# Patient Record
Sex: Male | Born: 1985 | Race: White | Hispanic: No | Marital: Married | Smoking: Former smoker
Health system: Southern US, Community
[De-identification: ages and names within clinical notes are randomized; demographics above are authoritative.]

## PROBLEM LIST (undated history)

## (undated) DIAGNOSIS — M549 Dorsalgia, unspecified: Secondary | ICD-10-CM

## (undated) DIAGNOSIS — Z789 Other specified health status: Secondary | ICD-10-CM

## (undated) DIAGNOSIS — G8929 Other chronic pain: Secondary | ICD-10-CM

## (undated) HISTORY — PX: NOSE SURGERY: SHX723

## (undated) HISTORY — PX: BACK SURGERY: SHX140

---
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
## 2014-07-01 HISTORY — PX: CERVICAL SPINE SURGERY: SHX589

## 2014-07-01 HISTORY — PX: TIBIA FRACTURE SURGERY: SHX806

## 2018-06-10 ENCOUNTER — Other Ambulatory Visit: Payer: Self-pay | Admitting: Orthopaedic Surgery

## 2018-07-02 NOTE — Pre-Procedure Instructions (Signed)
Trevor Clarke  07/02/2018      [id number] - [place], [place] - [name] [place] [address] Phone: [phone] Fax: [phone]    Your procedure is scheduled on January 8th.  Report to [facility] Admitting at 1045 A.M.  Call this number if you have problems the morning of surgery:  [phone]   Remember:  Do not eat or drink after midnight.    Take these medicines the morning of surgery with A SIP OF WATER  gabapentin (NEURONTIN)  7 days prior to surgery STOP taking any Aspirin (unless otherwise instructed by your surgeon), Aleve, Naproxen, Ibuprofen, Motrin, Advil, Goody's, BC's, all herbal medications, fish oil, and all vitamins.    Do not wear jewelry.  Do not wear lotions, powders, or colognes, or deodorant.  Men may shave face and neck.  Do not bring valuables to the hospital.  [facility] is not responsible for any belongings or valuables.  Contacts, dentures or bridgework may not be worn into surgery.  Leave your suitcase in the car.  After surgery it may be brought to your room.  For patients admitted to the hospital, discharge time will be determined by your treatment team.  Patients discharged the day of surgery will not be allowed to drive home.    [place]- Preparing For Surgery  Before surgery, you can play an important role. Because skin is not sterile, your skin needs to be as free of germs as possible. You can reduce the number of germs on your skin by washing with CHG (chlorahexidine gluconate) Soap before surgery.  CHG is an antiseptic cleaner which kills germs and bonds with the skin to continue killing germs even after washing.    Oral Hygiene is also important to reduce your risk of infection.  Remember - BRUSH YOUR TEETH THE MORNING OF SURGERY WITH YOUR REGULAR TOOTHPASTE  Please do not use if you have an allergy to CHG or antibacterial soaps. If your skin becomes reddened/irritated stop  using the CHG.  Do not shave (including legs and underarms) for at least 48 hours prior to first CHG shower. It is OK to shave your face.  Please follow these instructions carefully.   1. Shower the NIGHT BEFORE SURGERY and the MORNING OF SURGERY with CHG.   2. If you chose to wash your hair, wash your hair first as usual with your normal shampoo.  3. After you shampoo, rinse your hair and body thoroughly to remove the shampoo.  4. Use CHG as you would any other liquid soap. You can apply CHG directly to the skin and wash gently with a scrungie or a clean washcloth.   5. Apply the CHG Soap to your body ONLY FROM THE NECK DOWN.  Do not use on open wounds or open sores. Avoid contact with your eyes, ears, mouth and genitals (private parts). Wash Face and genitals (private parts)  with your normal soap.  6. Wash thoroughly, paying special attention to the area where your surgery will be performed.  7. Thoroughly rinse your body with warm water from the neck down.  8. DO NOT shower/wash with your normal soap after using and rinsing off the CHG Soap.  9. Pat yourself dry with a CLEAN TOWEL.  10. Wear CLEAN PAJAMAS to bed the night before surgery, wear comfortable clothes the morning of surgery  11. Place CLEAN SHEETS on your bed the night of your first shower and DO NOT SLEEP  WITH PETS.    Day of Surgery:  Do not apply any deodorants/lotions.  Please wear clean clothes to the hospital/surgery center.   Remember to brush your teeth WITH YOUR REGULAR TOOTHPASTE.    Please read over the following fact sheets that you were given.

## 2018-07-03 ENCOUNTER — Encounter (HOSPITAL_COMMUNITY): Payer: Self-pay

## 2018-07-03 ENCOUNTER — Encounter (HOSPITAL_COMMUNITY)
Admission: RE | Admit: 2018-07-03 | Discharge: 2018-07-03 | Disposition: A | Discharge status: Home / Self Care | Payer: Worker's Compensation | Source: Ambulatory Visit | Attending: Orthopaedic Surgery | Admitting: Orthopaedic Surgery

## 2018-07-03 ENCOUNTER — Other Ambulatory Visit: Payer: Self-pay

## 2018-07-03 DIAGNOSIS — Z01812 Encounter for preprocedural laboratory examination: Secondary | ICD-10-CM | POA: Insufficient documentation

## 2018-07-03 HISTORY — DX: Other specified health status: Z78.9

## 2018-07-03 LAB — CBC
HEMATOCRIT: 47.5 % (ref 39.0–52.0)
Hemoglobin: 15 g/dL (ref 13.0–17.0)
MCH: 27.6 pg (ref 26.0–34.0)
MCHC: 31.6 g/dL (ref 30.0–36.0)
MCV: 87.5 fL (ref 80.0–100.0)
Platelets: 237 10*3/uL (ref 150–400)
RBC: 5.43 MIL/uL (ref 4.22–5.81)
RDW: 12.5 % (ref 11.5–15.5)
WBC: 10.3 10*3/uL (ref 4.0–10.5)
nRBC: 0 % (ref 0.0–0.2)

## 2018-07-03 LAB — SURGICAL PCR SCREEN
MRSA, PCR: NEGATIVE
Staphylococcus aureus: NEGATIVE

## 2018-07-03 NOTE — Progress Notes (Signed)
PCP - patient denies Cardiologist - patient denies  Chest x-ray - n/a EKG - n/a Stress Test - patient denies ECHO - patient denies Cardiac Cath - patient denies  Sleep Study - patient denies CPAP -   Fasting Blood Sugar -  Checks Blood Sugar _____ times a day  Blood Thinner Instructions: Aspirin Instructions:  Anesthesia review: n/a  Patient denies shortness of breath, fever, cough and chest pain at PAT appointment   Patient verbalized understanding of instructions that were given to them at the PAT appointment. Patient was also instructed that they will need to review over the PAT instructions again at home before surgery.

## 2018-07-08 ENCOUNTER — Other Ambulatory Visit: Payer: Self-pay

## 2018-07-08 ENCOUNTER — Encounter (HOSPITAL_COMMUNITY): Payer: Self-pay

## 2018-07-08 ENCOUNTER — Ambulatory Visit (HOSPITAL_COMMUNITY): Payer: Worker's Compensation | Admitting: Certified Registered Nurse Anesthetist

## 2018-07-08 ENCOUNTER — Ambulatory Visit (HOSPITAL_COMMUNITY)
Admission: RE | Admit: 2018-07-08 | Discharge: 2018-07-09 | Disposition: A | Discharge status: Home / Self Care | Payer: Worker's Compensation | Attending: Orthopaedic Surgery | Admitting: Orthopaedic Surgery

## 2018-07-08 ENCOUNTER — Ambulatory Visit (HOSPITAL_COMMUNITY): Payer: Worker's Compensation

## 2018-07-08 ENCOUNTER — Encounter (HOSPITAL_COMMUNITY)
Admission: RE | Disposition: A | Discharge status: Home / Self Care | Payer: Self-pay | Source: Home / Self Care | Attending: Orthopaedic Surgery

## 2018-07-08 DIAGNOSIS — R200 Anesthesia of skin: Secondary | ICD-10-CM | POA: Diagnosis not present

## 2018-07-08 DIAGNOSIS — Z79899 Other long term (current) drug therapy: Secondary | ICD-10-CM | POA: Insufficient documentation

## 2018-07-08 DIAGNOSIS — R531 Weakness: Secondary | ICD-10-CM | POA: Diagnosis not present

## 2018-07-08 DIAGNOSIS — M19171 Post-traumatic osteoarthritis, right ankle and foot: Secondary | ICD-10-CM | POA: Insufficient documentation

## 2018-07-08 DIAGNOSIS — Z791 Long term (current) use of non-steroidal anti-inflammatories (NSAID): Secondary | ICD-10-CM | POA: Insufficient documentation

## 2018-07-08 DIAGNOSIS — Z981 Arthrodesis status: Secondary | ICD-10-CM

## 2018-07-08 DIAGNOSIS — T8484XA Pain due to internal orthopedic prosthetic devices, implants and grafts, initial encounter: Secondary | ICD-10-CM | POA: Diagnosis not present

## 2018-07-08 DIAGNOSIS — Z6836 Body mass index (BMI) 36.0-36.9, adult: Secondary | ICD-10-CM | POA: Diagnosis not present

## 2018-07-08 DIAGNOSIS — Z880 Allergy status to penicillin: Secondary | ICD-10-CM | POA: Diagnosis not present

## 2018-07-08 DIAGNOSIS — Z87891 Personal history of nicotine dependence: Secondary | ICD-10-CM | POA: Insufficient documentation

## 2018-07-08 DIAGNOSIS — Y793 Surgical instruments, materials and orthopedic devices (including sutures) associated with adverse incidents: Secondary | ICD-10-CM | POA: Insufficient documentation

## 2018-07-08 DIAGNOSIS — Z419 Encounter for procedure for purposes other than remedying health state, unspecified: Secondary | ICD-10-CM

## 2018-07-08 HISTORY — DX: Dorsalgia, unspecified: M54.9

## 2018-07-08 HISTORY — PX: ANKLE ARTHRODESIS: SUR49

## 2018-07-08 HISTORY — PX: HARDWARE REMOVAL: SHX979

## 2018-07-08 HISTORY — PX: ANKLE FUSION: SHX5718

## 2018-07-08 HISTORY — DX: Other chronic pain: G89.29

## 2018-07-08 SURGERY — ANKLE FUSION
Anesthesia: Choice | Site: Ankle | Laterality: Right

## 2018-07-08 MED ORDER — METHOCARBAMOL 500 MG PO TABS
ORAL_TABLET | ORAL | Status: AC
  Filled 2018-07-08: qty 1

## 2018-07-08 MED ORDER — 0.9 % SODIUM CHLORIDE (POUR BTL) OPTIME
TOPICAL | Status: DC | PRN
  Administered 2018-07-08: 1000 mL

## 2018-07-08 MED ORDER — METOCLOPRAMIDE HCL 5 MG/ML IJ SOLN: 5.0000 mg | Freq: Three times a day (TID) | INTRAMUSCULAR | Status: DC | PRN

## 2018-07-08 MED ORDER — FENTANYL CITRATE (PF) 100 MCG/2ML IJ SOLN
INTRAMUSCULAR | Status: AC
  Filled 2018-07-08: qty 2

## 2018-07-08 MED ORDER — ESMOLOL HCL 100 MG/10ML IV SOLN
INTRAVENOUS | Status: DC | PRN
  Administered 2018-07-08: 30 mg via INTRAVENOUS
  Administered 2018-07-08: 20 mg via INTRAVENOUS

## 2018-07-08 MED ORDER — DEXAMETHASONE SODIUM PHOSPHATE 10 MG/ML IJ SOLN
INTRAMUSCULAR | Status: DC | PRN
  Administered 2018-07-08: 10 mg via INTRAVENOUS

## 2018-07-08 MED ORDER — DEXAMETHASONE SODIUM PHOSPHATE 10 MG/ML IJ SOLN
INTRAMUSCULAR | Status: AC
  Filled 2018-07-08: qty 1

## 2018-07-08 MED ORDER — METHOCARBAMOL 500 MG PO TABS
500.0000 mg | ORAL_TABLET | Freq: Four times a day (QID) | ORAL | Status: DC | PRN
  Administered 2018-07-08 – 2018-07-09 (×2): 500 mg via ORAL
  Filled 2018-07-08: qty 1

## 2018-07-08 MED ORDER — CLINDAMYCIN PHOSPHATE 600 MG/50ML IV SOLN
600.0000 mg | Freq: Four times a day (QID) | INTRAVENOUS | Status: AC
  Administered 2018-07-08 – 2018-07-09 (×3): 600 mg via INTRAVENOUS
  Filled 2018-07-08 (×3): qty 50

## 2018-07-08 MED ORDER — ONDANSETRON HCL 4 MG PO TABS: 4.0000 mg | ORAL_TABLET | Freq: Four times a day (QID) | ORAL | Status: DC | PRN

## 2018-07-08 MED ORDER — ONDANSETRON HCL 4 MG/2ML IJ SOLN
INTRAMUSCULAR | Status: AC
  Filled 2018-07-08: qty 2

## 2018-07-08 MED ORDER — MIDAZOLAM HCL 2 MG/2ML IJ SOLN
INTRAMUSCULAR | Status: AC
  Administered 2018-07-08: 2 mg via INTRAVENOUS
  Filled 2018-07-08: qty 2

## 2018-07-08 MED ORDER — GABAPENTIN 300 MG PO CAPS
300.0000 mg | ORAL_CAPSULE | Freq: Three times a day (TID) | ORAL | Status: DC
  Administered 2018-07-08 – 2018-07-09 (×2): 300 mg via ORAL
  Filled 2018-07-08 (×2): qty 1

## 2018-07-08 MED ORDER — METHOCARBAMOL 1000 MG/10ML IJ SOLN
500.0000 mg | Freq: Four times a day (QID) | INTRAVENOUS | Status: DC | PRN
  Filled 2018-07-08: qty 5

## 2018-07-08 MED ORDER — MIDAZOLAM HCL 2 MG/2ML IJ SOLN
2.0000 mg | Freq: Once | INTRAMUSCULAR | Status: AC
  Administered 2018-07-08: 2 mg via INTRAVENOUS

## 2018-07-08 MED ORDER — DEXMEDETOMIDINE HCL IN NACL 200 MCG/50ML IV SOLN
INTRAVENOUS | Status: DC | PRN
  Administered 2018-07-08 (×5): 8 ug via INTRAVENOUS

## 2018-07-08 MED ORDER — ONDANSETRON HCL 4 MG/2ML IJ SOLN: 4.0000 mg | Freq: Four times a day (QID) | INTRAMUSCULAR | Status: DC | PRN

## 2018-07-08 MED ORDER — CHLORHEXIDINE GLUCONATE 4 % EX LIQD: 60.0000 mL | Freq: Once | CUTANEOUS | Status: DC

## 2018-07-08 MED ORDER — CLINDAMYCIN PHOSPHATE 900 MG/50ML IV SOLN
900.0000 mg | Freq: Once | INTRAVENOUS | Status: AC
  Administered 2018-07-08: 900 mg via INTRAVENOUS

## 2018-07-08 MED ORDER — FENTANYL CITRATE (PF) 100 MCG/2ML IJ SOLN
INTRAMUSCULAR | Status: DC | PRN
  Administered 2018-07-08: 25 ug via INTRAVENOUS
  Administered 2018-07-08 (×2): 50 ug via INTRAVENOUS
  Administered 2018-07-08: 25 ug via INTRAVENOUS
  Administered 2018-07-08: 50 ug via INTRAVENOUS
  Administered 2018-07-08: 25 ug via INTRAVENOUS
  Administered 2018-07-08: 50 ug via INTRAVENOUS
  Administered 2018-07-08 (×7): 25 ug via INTRAVENOUS
  Administered 2018-07-08: 50 ug via INTRAVENOUS

## 2018-07-08 MED ORDER — ASPIRIN 325 MG PO TABS: 325.0000 mg | ORAL_TABLET | Freq: Every day | ORAL | 11 refills | Status: DC

## 2018-07-08 MED ORDER — OXYCODONE HCL 5 MG PO TABS
5.0000 mg | ORAL_TABLET | ORAL | Status: DC | PRN
  Administered 2018-07-08 – 2018-07-09 (×3): 10 mg via ORAL
  Filled 2018-07-08 (×3): qty 2

## 2018-07-08 MED ORDER — OXYCODONE HCL 5 MG PO TABS: 5.0000 mg | ORAL_TABLET | ORAL | 0 refills | Status: AC | PRN

## 2018-07-08 MED ORDER — FENTANYL CITRATE (PF) 250 MCG/5ML IJ SOLN
INTRAMUSCULAR | Status: AC
  Filled 2018-07-08: qty 5

## 2018-07-08 MED ORDER — KETOROLAC TROMETHAMINE 30 MG/ML IJ SOLN
INTRAMUSCULAR | Status: AC
  Filled 2018-07-08: qty 1

## 2018-07-08 MED ORDER — LIDOCAINE 2% (20 MG/ML) 5 ML SYRINGE
INTRAMUSCULAR | Status: AC
  Filled 2018-07-08: qty 5

## 2018-07-08 MED ORDER — PROPOFOL 10 MG/ML IV BOLUS
INTRAVENOUS | Status: DC | PRN
  Administered 2018-07-08: 250 mg via INTRAVENOUS

## 2018-07-08 MED ORDER — BUPIVACAINE-EPINEPHRINE (PF) 0.5% -1:200000 IJ SOLN
INTRAMUSCULAR | Status: DC | PRN
  Administered 2018-07-08: 15 mL via PERINEURAL
  Administered 2018-07-08: 25 mL via PERINEURAL

## 2018-07-08 MED ORDER — FENTANYL CITRATE (PF) 100 MCG/2ML IJ SOLN
100.0000 ug | Freq: Once | INTRAMUSCULAR | Status: AC
  Administered 2018-07-08: 100 ug via INTRAVENOUS

## 2018-07-08 MED ORDER — HYDROMORPHONE HCL 1 MG/ML IJ SOLN: 0.5000 mg | INTRAMUSCULAR | Status: DC | PRN

## 2018-07-08 MED ORDER — NAPROXEN 250 MG PO TABS
500.0000 mg | ORAL_TABLET | Freq: Two times a day (BID) | ORAL | Status: DC
  Administered 2018-07-09: 500 mg via ORAL
  Filled 2018-07-08 (×3): qty 2

## 2018-07-08 MED ORDER — LIDOCAINE 2% (20 MG/ML) 5 ML SYRINGE
INTRAMUSCULAR | Status: DC | PRN
  Administered 2018-07-08: 60 mg via INTRAVENOUS

## 2018-07-08 MED ORDER — LACTATED RINGERS IV SOLN
INTRAVENOUS | Status: DC
  Administered 2018-07-08 (×2): via INTRAVENOUS

## 2018-07-08 MED ORDER — METOCLOPRAMIDE HCL 5 MG PO TABS: 5.0000 mg | ORAL_TABLET | Freq: Three times a day (TID) | ORAL | Status: DC | PRN

## 2018-07-08 MED ORDER — HYDROCODONE-ACETAMINOPHEN 5-325 MG PO TABS: 1.0000 | ORAL_TABLET | ORAL | Status: DC | PRN

## 2018-07-08 MED ORDER — DEXMEDETOMIDINE HCL IN NACL 200 MCG/50ML IV SOLN
INTRAVENOUS | Status: AC
  Filled 2018-07-08: qty 50

## 2018-07-08 MED ORDER — KETOROLAC TROMETHAMINE 30 MG/ML IJ SOLN
INTRAMUSCULAR | Status: DC | PRN
  Administered 2018-07-08: 30 mg via INTRAVENOUS

## 2018-07-08 MED ORDER — DOCUSATE SODIUM 100 MG PO CAPS
100.0000 mg | ORAL_CAPSULE | Freq: Two times a day (BID) | ORAL | Status: DC
  Filled 2018-07-08: qty 1

## 2018-07-08 MED ORDER — CLINDAMYCIN PHOSPHATE 900 MG/50ML IV SOLN
INTRAVENOUS | Status: AC
  Filled 2018-07-08: qty 50

## 2018-07-08 MED ORDER — ONDANSETRON HCL 4 MG/2ML IJ SOLN
INTRAMUSCULAR | Status: DC | PRN
  Administered 2018-07-08 (×2): 4 mg via INTRAVENOUS

## 2018-07-08 SURGICAL SUPPLY — 82 items
BANDAGE ACE 4X5 VEL STRL LF (GAUZE/BANDAGES/DRESSINGS) ×4 IMPLANT
BANDAGE ACE 6X5 VEL STRL LF (GAUZE/BANDAGES/DRESSINGS) ×4 IMPLANT
BANDAGE ESMARK 6X9 LF (GAUZE/BANDAGES/DRESSINGS) ×2 IMPLANT
BENZOIN TINCTURE PRP APPL 2/3 (GAUZE/BANDAGES/DRESSINGS) IMPLANT
BIT DRILL CANN LNG FLUTE 3.0 (BIT) IMPLANT
BIT DRILL CANNULATED 3.0 (BIT) ×4
BLADE CUDA GRT WHITE 3.5 (BLADE) IMPLANT
BLADE SURG 15 STRL LF DISP TIS (BLADE) ×2 IMPLANT
BLADE SURG 15 STRL SS (BLADE) ×2
BNDG COHESIVE 4X5 TAN STRL (GAUZE/BANDAGES/DRESSINGS) IMPLANT
BNDG ESMARK 4X9 LF (GAUZE/BANDAGES/DRESSINGS) IMPLANT
BNDG ESMARK 6X9 LF (GAUZE/BANDAGES/DRESSINGS) ×4
BUR CUDA 2.9 (BURR) IMPLANT
BUR CUDA 2.9MM (BURR)
BUR GATOR 2.9 (BURR) IMPLANT
BUR GATOR 2.9MM (BURR)
CHLORAPREP W/TINT 26ML (MISCELLANEOUS) ×4 IMPLANT
CLOSURE WOUND 1/2 X4 (GAUZE/BANDAGES/DRESSINGS)
COVER BACK TABLE 60X90IN (DRAPES) ×4 IMPLANT
COVER WAND RF STERILE (DRAPES) IMPLANT
CUFF TOURNIQUET SINGLE 34IN LL (TOURNIQUET CUFF) ×4 IMPLANT
DECANTER SPIKE VIAL GLASS SM (MISCELLANEOUS) IMPLANT
DRAPE ARTHROSCOPY W/POUCH 114 (DRAPES) ×4 IMPLANT
DRAPE C-ARM 42X72 X-RAY (DRAPES) ×2 IMPLANT
DRAPE C-ARMOR (DRAPES) ×2 IMPLANT
DRAPE IMP U-DRAPE 54X76 (DRAPES) ×4 IMPLANT
DRAPE OEC MINIVIEW 54X84 (DRAPES) ×4 IMPLANT
DRAPE U-SHAPE 47X51 STRL (DRAPES) ×4 IMPLANT
DRSG XEROFORM 1X8 (GAUZE/BANDAGES/DRESSINGS) ×4 IMPLANT
ELECT REM PT RETURN 9FT ADLT (ELECTROSURGICAL)
ELECTRODE REM PT RTRN 9FT ADLT (ELECTROSURGICAL) IMPLANT
GAUZE SPONGE 4X4 12PLY STRL (GAUZE/BANDAGES/DRESSINGS) ×4 IMPLANT
GAUZE XEROFORM 1X8 LF (GAUZE/BANDAGES/DRESSINGS) ×4 IMPLANT
GLOVE BIO SURGEON STRL SZ 6.5 (GLOVE) ×3 IMPLANT
GLOVE BIO SURGEONS STRL SZ 6.5 (GLOVE) ×3
GLOVE BIOGEL M 7.0 STRL (GLOVE) ×2 IMPLANT
GLOVE BIOGEL M STRL SZ7.5 (GLOVE) ×4 IMPLANT
GLOVE BIOGEL PI IND STRL 8 (GLOVE) ×2 IMPLANT
GLOVE BIOGEL PI INDICATOR 8 (GLOVE) ×2
GOWN STRL REUS W/ TWL LRG LVL3 (GOWN DISPOSABLE) ×2 IMPLANT
GOWN STRL REUS W/ TWL XL LVL3 (GOWN DISPOSABLE) ×2 IMPLANT
GOWN STRL REUS W/TWL LRG LVL3 (GOWN DISPOSABLE) ×2
GOWN STRL REUS W/TWL XL LVL3 (GOWN DISPOSABLE) ×2
KIT BASIN OR (CUSTOM PROCEDURE TRAY) ×4 IMPLANT
NDL SPNL 18GX3.5 QUINCKE PK (NEEDLE) IMPLANT
NEEDLE SPNL 18GX3.5 QUINCKE PK (NEEDLE) IMPLANT
NS IRRIG 1000ML POUR BTL (IV SOLUTION) ×4 IMPLANT
PAD CAST 4YDX4 CTTN HI CHSV (CAST SUPPLIES) ×2 IMPLANT
PADDING CAST COTTON 4X4 STRL (CAST SUPPLIES) ×4
PADDING CAST COTTON 6X4 STRL (CAST SUPPLIES) ×4 IMPLANT
PADDING CAST SYNTHETIC 4 (CAST SUPPLIES) ×2
PADDING CAST SYNTHETIC 4X4 STR (CAST SUPPLIES) ×2 IMPLANT
PENCIL BUTTON HOLSTER BLD 10FT (ELECTRODE) IMPLANT
PLATE ANKLE FUSION TT RT (Plate) ×2 IMPLANT
SCREW BONE TI LP 4.5X24 (Screw) ×2 IMPLANT
SCREW BONE TI LP 4.5X30 (Screw) ×2 IMPLANT
SCREW BONE TI LP 4.5X36 (Screw) ×4 IMPLANT
SCREW BONE TI LP 5.5X50 (Screw) ×2 IMPLANT
SCREW L/P LOCK TI 4.5X20 (Screw) ×2 IMPLANT
SCREW L/P TI 4.5X20 (Screw) ×2 IMPLANT
SCREW LOW PRO CANN 6.7X60 (Screw) ×2 IMPLANT
SCREW LOW PRO TI 4.5X34 (Screw) ×2 IMPLANT
SCREW LOW PROFILE 4.5X32 (Screw) ×4 IMPLANT
SLEEVE SCD COMPRESS KNEE MED (MISCELLANEOUS) ×4 IMPLANT
SPLINT FIBERGLASS 4X30 (CAST SUPPLIES) ×4 IMPLANT
SPONGE LAP 18X18 RF (DISPOSABLE) IMPLANT
STOCKINETTE 6  STRL (DRAPES) ×2
STOCKINETTE 6 STRL (DRAPES) ×2 IMPLANT
STRAP ANKLE FOOT DISTRACTOR (ORTHOPEDIC SUPPLIES) ×4 IMPLANT
STRIP CLOSURE SKIN 1/2X4 (GAUZE/BANDAGES/DRESSINGS) IMPLANT
SUCTION FRAZIER HANDLE 10FR (MISCELLANEOUS) ×2
SUCTION TUBE FRAZIER 10FR DISP (MISCELLANEOUS) ×2 IMPLANT
SUT ETHILON 3 0 PS 1 (SUTURE) ×4 IMPLANT
SUT MNCRL AB 3-0 PS2 18 (SUTURE) ×4 IMPLANT
SUT PDS AB 2-0 CT2 27 (SUTURE) ×4 IMPLANT
SUT VIC AB 3-0 FS2 27 (SUTURE) IMPLANT
SYR BULB 3OZ (MISCELLANEOUS) IMPLANT
TOWEL GREEN STERILE FF (TOWEL DISPOSABLE) ×8 IMPLANT
TUBE CONNECTING 20'X1/4 (TUBING) ×1
TUBE CONNECTING 20X1/4 (TUBING) ×3 IMPLANT
TUBING ARTHRO INFLOW-ONLY STRL (TUBING) ×4 IMPLANT
UNDERPAD 30X30 (UNDERPADS AND DIAPERS) ×4 IMPLANT

## 2018-07-08 NOTE — Progress Notes (Signed)
Patient scheduled as outpatient procedure.  At 1815 discharge instructions were reviewed with family and patient was transferred to wheelchair for discharge home.  Patient complained that he was having numbness down his left leg/foot, but also stated that he has had a pinched nerve in his back that sometimes causes this sensation.  Upon trying to stand up and transfer into the car, patient was unable to pivot on his non-operative leg due to the severe numbness he was experiencing.  He has non-weightbearing orders for his operative leg, so family was concerned he would not be able to get around at home without being able to use his non-operative leg.  Patient states that though he has had similar sensation before, the numbness and tingling is worse than it has ever been.  Patient was brought back into the recovery room. Vital signs stable.  Attempted to reach Dr. Susa Simmonds without success.  Dr. Ave Filter (on call) was notified and spoke with Dr. Susa Simmonds who wrote orders for overnight admission.  Patient and family are agreeable to this plan.  Will admit to [facility] overnight.

## 2018-07-08 NOTE — Discharge Instructions (Signed)
No NSAIDS before 7:30pm today  [facility] POST-OP INSTRUCTIONS   Pain Management 1. The numbing medicine and your leg will last around 8 hours, take a dose of your pain medicine as soon as you feel it wearing off to avoid rebound pain. 2. Keep your foot elevated above heart level.  Make sure that your heel hangs free ('floats'). 3. Take all prescribed medication as directed. 4. If taking narcotic pain medication you may want to use an over-the-counter stool softener to avoid constipation. 5. You may take over-the-counter NSAIDs (ibuprofen, naproxen, etc.) as well as over-the-counter acetaminophen as directed on the packaging as a supplement for your pain and may also use it to wean away from the prescription medication.  Activity ?  ? Non-weightbearing ? Postoperatively, you will be placed into a splint which stays on for 2 weeks and then will be changed at your first postop visit.  First Postoperative Visit 1. Your first postop visit will be at least 2 weeks after surgery.  This should be scheduled when you schedule surgery. 2. If you do not have a postoperative visit scheduled please call [phone] to schedule an appointment. 3. At the appointment your incision will be evaluated for suture removal, x-rays will be obtained if necessary.  General Instructions 1. Swelling is very common after foot and ankle surgery.  It often takes 3 months for the foot and ankle to begin to feel comfortable.  Some amount of swelling will persist for 6-12 months. 2. DO NOT change the dressing.  If there is a problem with the dressing (too tight, loose, gets wet, etc.) please contact Dr. Donnie MesaAdair's office. 3. DO NOT get the dressing wet.  For showers you can use an over-the-counter cast cover or wrap a washcloth around the top of your dressing and then cover it with a plastic bag and tape it to your leg. 4. DO NOT soak the incision (no tubs, pools, bath, etc.) until you have approval from  Dr. Susa SimmondsAdair.  Contact Dr. Garret ReddishAdairs office or go to Emergency Room if: 1. Temperature above 101 F. 2. Increasing pain that is unresponsive to pain medication or elevation 3. Excessive redness or swelling in your foot 4. Dressing problems - excessive bloody drainage, looseness or tightness, or if dressing gets wet 5. Develop pain, swelling, warmth, or discoloration of your calf   General Anesthesia, Adult, Care After This sheet gives you information about how to care for yourself after your procedure. Your health care provider may also give you more specific instructions. If you have problems or questions, contact your health care provider. What can I expect after the procedure? After the procedure, the following side effects are common:  Pain or discomfort at the IV site.  Nausea.  Vomiting.  Sore throat.  Trouble concentrating.  Feeling cold or chills.  Weak or tired.  Sleepiness and fatigue.  Soreness and body aches. These side effects can affect parts of the body that were not involved in surgery. Follow these instructions at home:  For at least 24 hours after the procedure:  Have a responsible adult stay with you. It is important to have someone help care for you until you are awake and alert.  Rest as needed.  Do not: ? Participate in activities in which you could fall or become injured. ? Drive. ? Use heavy machinery. ? Drink alcohol. ? Take sleeping pills or medicines that cause drowsiness. ? Make important decisions or sign legal documents. ? Take  care of children on your own. Eating and drinking  Follow any instructions from your health care provider about eating or drinking restrictions.  When you feel hungry, start by eating small amounts of foods that are soft and easy to digest (bland), such as toast. Gradually return to your regular diet.  Drink enough fluid to keep your urine pale yellow.  If you vomit, rehydrate by drinking water, juice, or clear  broth. General instructions  If you have sleep apnea, surgery and certain medicines can increase your risk for breathing problems. Follow instructions from your health care provider about wearing your sleep device: ? Anytime you are sleeping, including during daytime naps. ? While taking prescription pain medicines, sleeping medicines, or medicines that make you drowsy.  Return to your normal activities as told by your health care provider. Ask your health care provider what activities are safe for you.  Take over-the-counter and prescription medicines only as told by your health care provider.  If you smoke, do not smoke without supervision.  Keep all follow-up visits as told by your health care provider. This is important. Contact a health care provider if:  You have nausea or vomiting that does not get better with medicine.  You cannot eat or drink without vomiting.  You have pain that does not get better with medicine.  You are unable to pass urine.  You develop a skin rash.  You have a fever.  You have redness around your IV site that gets worse. Get help right away if:  You have difficulty breathing.  You have chest pain.  You have blood in your urine or stool, or you vomit blood. Summary  After the procedure, it is common to have a sore throat or nausea. It is also common to feel tired.  Have a responsible adult stay with you for the first 24 hours after general anesthesia. It is important to have someone help care for you until you are awake and alert.  When you feel hungry, start by eating small amounts of foods that are soft and easy to digest (bland), such as toast. Gradually return to your regular diet.  Drink enough fluid to keep your urine pale yellow.  Return to your normal activities as told by your health care provider. Ask your health care provider what activities are safe for you. This information is not intended to replace advice given to you by your  health care provider. Make sure you discuss any questions you have with your health care provider. Document Released: 09/23/2000 Document Revised: 01/31/2017 Document Reviewed: 01/31/2017 Elsevier Interactive Patient Education  2019 [employer].

## 2018-07-08 NOTE — Op Note (Signed)
Trevor Clarke male 33 y.o. 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 male sustained an ankle fracture several years ago.  He underwent open reduction internal fixation.  Unfortunately he went on to severe posttraumatic ankle arthritis with distal tibia and fibula synostosis.  He recently got his leg caught in a machine at work which twisted it and flared up his arthritis.  He was recalcitrant to conservative treatment to get him back to his baseline in the form of boot immobilization, physical therapy, intra-articular injection, activity modifications, ASO brace usage.  He continued to have pain and therefore was indicated for ankle arthrodesis.  We did try an [place] brace and this was rubbing the orthopedic hardware in the fibula and in the medial malleolus from his prior surgery previously and therefore he was indicated for hardware removal given the amount of prominence and skin irritation from the underlying hardware.  We discussed the risk, benefits and alternatives of surgery which included but were not limited to wound healing complications, nonunion, malunion, need for second surgery, damage to surrounding structures, continued pain, perioperative and anesthetic risks which include death.  After weighing these risks he wished to proceed.  PROCEDURE: Patient was identified in the preoperative holding area.  The right  lower extremity was marked by myself.  The consent was signed by myself and the patient.  Anesthesia performed a peripheral nerve block.  He was taken the operative suite and placed supine on the operative table.  General anesthesia was induced without difficulty.  A thigh tourniquet was placed in the right leg.  Preoperative antibiotics were given.  A bump was placed under the right hip and all bony prominences were well-padded.  The right lower extremity was prepped and draped in the usual sterile fashion.  A surgical timeout was performed.  We began by making an incision over the previously done lateral incision over the fibula.  This was at the area of the prominent hardware.  This taken sharply down through skin subcutaneous tissue.  The deep tissue was mobilized bluntly to identify the plate and screws.  Using a curette the screw heads were cleaned out and removed without difficulty.  The plate was removed.  There was some bony prominence from ingrowth and overgrowth which was removed with a rondure down to a smooth fibula.  We then turned our attention to the medial malleolus and through a separate 3 cm incision the medial malleolus screws were identified.  They had washers on them that were embedded within the bone.  Using a screwdriver attempt was made to remove the screws however the screws were so embedded that the screwdriver stripped.  Then using a locking vice grip device the screws were removed.  During removal of the second screw the screw head broke off and the remainder the screw was embedded within the bone.  There is no prominence and therefore that was left in place.  We then irrigated out the lateral medial wounds and closed the deep tissue with 2-0 PDS.  The subcuticular tissue was closed with 3-0 Monocryl and the skin  with 3-0 nylon.  We then turned our attention to the anterior ankle and an anterior approach the ankle joint was performed.  This taken sharply down through skin  subcutaneous tissue.  The tibialis anterior tendon was identified and mobilized medially.  The extensor hallucis longus muscle belly and tendon was mobilized medially.  Then the neurovascular bundle was protected this way.  Then this was taken down sharply to into periosteum and anterior joint capsule tissue.  This was entered sharply with 15 blade.  Upon entering the ankle joint was noted there is significant amount of free of bony fragments and large osteophyte on the talar dome and anterior tibia.  Using a large rondure and osteotome these were removed to allow entry into the ankle joint.  Upon inspection ankle joint there was virtually no cartilage anteriorly.  There is some remaining cartilage posteriorly.  Using a curette the cartilage was removed from the ankle joint on the tibial plafond and the talar dome.  This was also performed for the medial gutter and lateral gutter.  All cartilage was removed.  Irrigation was performed to ensure all cartilage was removed.  Then the bony prominences were further removed to allow for good plate seating and fixation.  The tibial plafond and the talar dome was trephinated using a 3.5 mm drill.  This was to allow for good bony contact and healing.  Then using a rondure the cortical bone on the tibial plafond and the talar dome was ground up again allowing for good cancellus bone contact.  Then a K wire was used from medial tibia in an anterograde fashion across the ankle joint holding it in a good reduced position.  Then intraoperative fluoroscopy was used to confirm good position of the tibiotalar joint and good bony contact.  This was obtained.  Then a anterior ankle arthrodesis plate was placed and locked distally and then compression was performed through the plate.  Upon attempting compression it was noted that the purchase was not great and therefore a 6.7 millimeter screw was placed from the medial to lateral direction across the tibiotalar joint to allow for  compression.  This had good bite and compression.  Then the remainder of the plate was fixed with a combination of locking and nonlocking screws.  The compression screw was added and obtained good purchase at this point.  Then the ankle joint was inspected and found to have good bony contact anteriorly through the incision.  The wound was then copiously irrigated.  The tourniquet was let down and hemostasis was obtained.  The deep tissue about the EHL and tibialis anterior tendon sheaths were closed with 2-0 PDS suture.  The subcuticular tissue was closed with 3-0 Monocryl and the skin with 3-0 nylon.  The patient was placed in a soft dressing of Xeroform, 4 x 4's and sterile she cotton.  A short leg posterior sugar tong splint was placed without difficulty.  Then the patient was awakened from anesthesia and taken to the recovery room in stable condition.  There were no complications.  The counts were correct at the end of the case.  POST OPERATIVE INSTRUCTIONS: Nonweightbearing right lower extremity Keep splint dry Take 1 325 mg aspirin daily for DVT prophylaxis Call the office with any concerns He will follow-up in 2 weeks for wound check, cast placement and x-rays.   TOURNIQUET TIME: 2 hours  BLOOD LOSS:  100 cc         DRAINS: none  SPECIMEN: none       COMPLICATIONS:  * No complications entered in OR log *         Disposition: PACU - hemodynamically stable.         Condition: stable

## 2018-07-08 NOTE — Anesthesia Procedure Notes (Signed)
Anesthesia Regional Block: Popliteal block   Pre-Anesthetic Checklist: ,, timeout performed, Correct Patient, Correct Site, Correct Laterality, Correct Procedure, Correct Position, site marked, Risks and benefits discussed,  Surgical consent,  Pre-op evaluation,  At surgeon's request and post-op pain management  Laterality: Right  Prep: chloraprep       Needles:  Injection technique: Single-shot  Needle Type: Echogenic Stimulator Needle          Additional Needles:   Narrative:  Start time: 07/08/2018 12:26 PM End time: 07/08/2018 12:36 PM Injection made incrementally with aspirations every 5 mL.  Performed by: Personally  Anesthesiologist: Heather Roberts, MD  Additional Notes: A functioning IV was confirmed and monitors were applied.  Sterile prep and drape, hand hygiene and sterile gloves were used.  Negative aspiration and test dose prior to incremental administration of local anesthetic. The patient tolerated the procedure well.Ultrasound  guidance: relevant anatomy identified, needle position confirmed, local anesthetic spread visualized around nerve(s), vascular puncture avoided.  Image printed for medical record. ACB supplement.

## 2018-07-08 NOTE — Anesthesia Preprocedure Evaluation (Addendum)
Anesthesia Evaluation  Patient identified by MRN, date of birth, ID band Patient awake    Reviewed: Allergy & Precautions, NPO status , Patient's Chart, lab work & pertinent test results  Airway Mallampati: II  TM Distance: >3 FB Neck ROM: Full    Dental no notable dental hx. (+) Dental Advisory Given   Pulmonary neg pulmonary ROS, former smoker,    Pulmonary exam normal        Cardiovascular negative cardio ROS Normal cardiovascular exam     Neuro/Psych negative neurological ROS  negative psych ROS   GI/Hepatic negative GI ROS, Neg liver ROS,   Endo/Other  Morbid obesity  Renal/GU negative Renal ROS  negative genitourinary   Musculoskeletal negative musculoskeletal ROS (+)   Abdominal   Peds negative pediatric ROS (+)  Hematology negative hematology ROS (+)   Anesthesia Other Findings   Reproductive/Obstetrics negative OB ROS                            Anesthesia Physical Anesthesia Plan  ASA: II  Anesthesia Plan: General   Post-op Pain Management: GA combined w/ Regional for post-op pain   Induction:   PONV Risk Score and Plan: 3 and Ondansetron, Dexamethasone and Scopolamine patch - Pre-op  Airway Management Planned: LMA  Additional Equipment:   Intra-op Plan:   Post-operative Plan: Extubation in OR  Informed Consent: I have reviewed the patients History and Physical, chart, labs and discussed the procedure including the risks, benefits and alternatives for the proposed anesthesia with the patient or authorized representative who has indicated his/her understanding and acceptance.   Dental advisory given  Plan Discussed with: CRNA, Anesthesiologist and Surgeon  Anesthesia Plan Comments:         Anesthesia Quick Evaluation

## 2018-07-08 NOTE — H&P (Signed)
Trevor Clarke is an 33 y.o. male.   Chief Complaint: Right ankle pain with symptomatic orthopedic hardware and end-stage posttraumatic ankle arthritis HPI: Patient sustained an ankle fracture dislocation several years ago that underwent open treatment.  Since that time he developed some posttraumatic ankle arthritis and then while at work several months ago he got his foot caught in a compressive machine that caused significant pain and swelling about the ankle.  Since that time we have tried several conservative treatment measures such as boot immobilization, therapy, anti-inflammatories, injection without much relief.  Given the severity of his arthritis and symptomatic hardware he is indicated for hardware removal and ankle arthrodesis.  He is here today for surgery.  He had several questions with his wife and these were all answered.  Past Medical History:  Diagnosis Date  . Medical history non-contributory     Past Surgical History:  Procedure Laterality Date  . BACK SURGERY     L5-S1 microdiscectomy  . CERVICAL SPINE SURGERY  2016  . FRACTURE SURGERY Right 2003   upper arm  . NOSE SURGERY Right    reattached nostril  . ORIF TIBIA & FIBULA FRACTURES Right 10/1998  . PELVIC FRACTURE SURGERY Right 2003  . TIBIA FRACTURE SURGERY Left 2016    History reviewed. No pertinent family history. Social History:  reports that he quit smoking about a year ago. He has never used smokeless tobacco. He reports previous alcohol use. He reports that he does not use drugs.  Allergies:  Allergies  Allergen Reactions  . Penicillins Other (See Comments)    Childhood allergy, found out allergy when IV was taken out at hospital DID THE REACTION INVOLVE: Swelling of the face/tongue/throat, SOB, or low BP? Unknown Sudden or severe rash/hives, skin peeling, or the inside of the mouth or nose? Unknown DID IT REQUIRE MEDICAL TREATMENT ?  #  #  #  YES  #  #  #  When did it last happen? Infant      Medications Prior to Admission  Medication Sig Dispense Refill  . Cyanocobalamin (VITAMIN B-12 PO) Take 2 each by mouth daily.    Marland Kitchen gabapentin (NEURONTIN) 300 MG capsule Take 300 mg by mouth 3 (three) times daily.    . Multiple Vitamins-Minerals (MULTIVITAMIN ADULT) CHEW Chew 2 each by mouth daily.    . naproxen (NAPROSYN) 500 MG tablet Take 500 mg by mouth 2 (two) times daily with a meal.    . Polyethyl Glycol-Propyl Glycol (SYSTANE) 0.4-0.3 % SOLN Place 1 drop into the left eye daily as needed (for dry eyes).      No results found for this or any previous visit (from the past 48 hour(s)). No results found.  Review of Systems  Constitutional: Negative.   Eyes: Negative.   Respiratory: Negative.   Cardiovascular: Negative.   Gastrointestinal: Negative.   Musculoskeletal:       Right ankle pain  Psychiatric/Behavioral: Negative.     Blood pressure 135/72, pulse 79, temperature (!) 97.4 F (36.3 C), temperature source Oral, resp. rate 14, height 6' (1.829 m), weight 122.5 kg, SpO2 100 %. Physical Exam  Constitutional: He appears well-developed.  HENT:  Head: Normocephalic.  Eyes: Conjunctivae are normal.  Neck: Neck supple.  Cardiovascular: Normal rate.  Respiratory: Effort normal.  GI: Soft.  Musculoskeletal:     Comments: Evaluation of the right ankle demonstrates limited motion.  Tenderness palpation anteriorly about the ankle.  There is a prominent lateral medial hardware.  Motion maintained through  transverse tarsal joints and subtalar joint.  Motor intact.  Sensation intact.  Foot is warm and well-perfused with palpable dorsalis pedis pulse.  Neurological: He is alert.  Skin: Skin is warm.  Psychiatric: He has a normal mood and affect.     Assessment/Plan We will proceed with planned surgery.  We will remove the lateral plate and the medial screws.  We will also perform an ankle arthrodesis through an anterior approach.  He understands the risk, benefits and  alternatives which were discussed with him and his wife.  The risks discussed included but were not limited to wound healing complications, infection, nonunion, malunion, continued pain, need for second surgery, demonstrating structures, less than optimal outcome.  We also discussed the perioperative and anesthetic risks which include death.  After weighing these risks they opted to proceed.  He understands that he will be nonweightbearing for a minimum of 6 weeks.  Terance Hart, MD 07/08/2018, 12:23 PM

## 2018-07-08 NOTE — Transfer of Care (Signed)
Immediate Anesthesia Transfer of Care Note  Patient: Trevor Vanantwerp  Procedure(s) Performed: ANKLE ARTHRODESIS (Right Ankle) HARDWARE REMOVAL (Right )  Patient Location: PACU  Anesthesia Type:GA combined with regional for post-op pain  Level of Consciousness: drowsy and patient cooperative  Airway & Oxygen Therapy: Patient Spontanous Breathing and Patient connected to face mask oxygen  Post-op Assessment: Report given to RN and Post -op Vital signs reviewed and stable  Post vital signs: Reviewed and stable  Last Vitals:  Vitals Value Taken Time  BP 109/60 07/08/2018  4:21 PM  Temp    Pulse 96 07/08/2018  4:21 PM  Resp 11 07/08/2018  4:21 PM  SpO2 98 % 07/08/2018  4:21 PM  Vitals shown include unvalidated device data.  Last Pain:  Vitals:   07/08/18 1112  TempSrc:   PainSc: 8       Patients Stated Pain Goal: 2 (07/08/18 1112)  Complications: No apparent anesthesia complications

## 2018-07-08 NOTE — Anesthesia Procedure Notes (Signed)
Procedure Name: LMA Insertion Date/Time: 07/08/2018 12:54 PM Performed by: Pearson Grippe, CRNA Pre-anesthesia Checklist: Patient identified, Emergency Drugs available, Suction available and Patient being monitored Patient Re-evaluated:Patient Re-evaluated prior to induction Oxygen Delivery Method: Circle system utilized Preoxygenation: Pre-oxygenation with 100% oxygen Induction Type: IV induction Ventilation: Mask ventilation without difficulty LMA: LMA inserted LMA Size: 5.0 Number of attempts: 1 Placement Confirmation: positive ETCO2 Tube secured with: Tape Dental Injury: Teeth and Oropharynx as per pre-operative assessment

## 2018-07-08 NOTE — Anesthesia Postprocedure Evaluation (Signed)
Anesthesia Post Note  Patient: Trevor Clarke  Procedure(s) Performed: ANKLE ARTHRODESIS (Right Ankle) HARDWARE REMOVAL (Right )     Patient location during evaluation: PACU Anesthesia Type: General Level of consciousness: sedated Pain management: pain level controlled Vital Signs Assessment: post-procedure vital signs reviewed and stable Respiratory status: spontaneous breathing and respiratory function stable Cardiovascular status: stable Postop Assessment: no apparent nausea or vomiting Anesthetic complications: no    Last Vitals:  Vitals:   07/08/18 1720 07/08/18 1724  BP: 123/77   Pulse: 96 95  Resp: 13 16  Temp: 36.5 C   SpO2: 94% 96%    Last Pain:  Vitals:   07/08/18 1650  TempSrc:   PainSc: 0-No pain                 Zakk Borgen DANIEL

## 2018-07-09 ENCOUNTER — Encounter (HOSPITAL_COMMUNITY): Payer: Self-pay | Admitting: General Practice

## 2018-07-09 DIAGNOSIS — M19171 Post-traumatic osteoarthritis, right ankle and foot: Secondary | ICD-10-CM | POA: Diagnosis not present

## 2018-07-09 MED ORDER — METHOCARBAMOL 500 MG PO TABS: 500.0000 mg | ORAL_TABLET | Freq: Four times a day (QID) | ORAL | 0 refills | Status: DC | PRN

## 2018-07-09 MED ORDER — METHYLPREDNISOLONE 4 MG PO TBPK: 4.0000 mg | ORAL_TABLET | Freq: Four times a day (QID) | ORAL | Status: DC

## 2018-07-09 MED ORDER — METHYLPREDNISOLONE 4 MG PO TBPK: 8.0000 mg | ORAL_TABLET | Freq: Every evening | ORAL | Status: DC

## 2018-07-09 MED ORDER — OXYCODONE HCL 5 MG PO TABS: 5.0000 mg | ORAL_TABLET | ORAL | 0 refills | Status: DC | PRN

## 2018-07-09 MED ORDER — METHYLPREDNISOLONE 4 MG PO TBPK
4.0000 mg | ORAL_TABLET | ORAL | Status: AC
  Administered 2018-07-09: 4 mg via ORAL

## 2018-07-09 MED ORDER — METHYLPREDNISOLONE 4 MG PO TBPK: 4.0000 mg | ORAL_TABLET | ORAL | Status: DC

## 2018-07-09 MED ORDER — METHYLPREDNISOLONE 4 MG PO TBPK
8.0000 mg | ORAL_TABLET | Freq: Every morning | ORAL | Status: AC
  Administered 2018-07-09: 8 mg via ORAL
  Filled 2018-07-09: qty 21

## 2018-07-09 MED ORDER — METHYLPREDNISOLONE 4 MG PO TBPK: 4.0000 mg | ORAL_TABLET | Freq: Three times a day (TID) | ORAL | Status: DC

## 2018-07-09 NOTE — Plan of Care (Signed)
  Problem: Education: Goal: Knowledge of General Education information will improve Description: Including pain rating scale, medication(s)/side effects and non-pharmacologic comfort measures Outcome: Progressing   Problem: Pain Managment: Goal: General experience of comfort will improve Outcome: Progressing   Problem: Safety: Goal: Ability to remain free from injury will improve Outcome: Progressing   

## 2018-07-09 NOTE — Progress Notes (Signed)
Patient discharging home. Discharge instructions explained to patient and wife and they both verbalized understanding. Took all personal belongings. No further questions or concerns voiced.  

## 2018-07-09 NOTE — Plan of Care (Signed)
  Problem: Pain Managment: Goal: General experience of comfort will improve Outcome: Progressing   Problem: Safety: Goal: Ability to remain free from injury will improve Outcome: Progressing   

## 2018-07-09 NOTE — Care Management Note (Signed)
Case Management Note  Patient Details  Name: Trevor ConchMatthew Salminen MRN: [id number] Date of Birth: 12-Dec-1985  Subjective/Objective: Patient is a 33 y/o male s/p R ankle hardware removal and ankle fusion on 07/08/18.                    Action/Plan:  Case manager spoke with patient and wife concerning discharge plan. Patient has no HH needs, only DME. CM faxed order to his [employer] workers comp RN CM- Kim Kluba:,[phone], [phone]. Selena BattenKim says she will have RW delivered to patient's home, also having a knee scooter delivered. Patient will borrow a RW until his arrives.  Will have family support at discharge.   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

## 2018-07-09 NOTE — Progress Notes (Signed)
Subjective: 1 Day Post-Op Procedure(s) (LRB): ANKLE ARTHRODESIS (Right) HARDWARE REMOVAL (Right) Patient status post right ankle hardware removal and ankle arthrodesis.  Upon attempted discharge to the PACU yesterday patient noted significant burning sensations in his left lower extremity as well as some numbness and weakness.  He does have known disc herniation with continued numbness however this was an exacerbation.  Given this he was brought into the hospital for monitoring.  Overnight the pain and paresthetic type symptoms have improved significantly.  He also has noted increase in his ankle dorsiflexion strength.  He continues to have numbness from the proximal leg down laterally to the lateral toes.  Denies any pain in the right lower extremity.  He is been able to sit up at bedside.  Objective: Vital signs in last 24 hours: Temp:  [97.3 F (36.3 C)-99.2 F (37.3 C)] 98.2 F (36.8 C) (01/09 0425) Pulse Rate:  [79-100] 92 (01/09 0425) Resp:  [11-20] 18 (01/09 0425) BP: (109-142)/(60-85) 120/69 (01/09 0425) SpO2:  [92 %-100 %] 98 % (01/09 0425) Weight:  [122.5 kg] 122.5 kg (01/08 1056)  Intake/Output from previous day: 01/08 0701 - 01/09 0700 In: 2380 [P.O.:480; I.V.:1900] Out: 950 [Urine:850; Blood:100] Intake/Output this shift: No intake/output data recorded.  He is awake and alert and in no acute distress Evaluation of the left lower extremity demonstrates some decrease sensation and paresthesias to the lateral leg just distal to the knee down to the lateral toes.  No pain with palpation.  He has decreased sensation about the lateral toes.  He also has weakness with ankle dorsiflexion peroneal tendon strength testing.  He has strength intact with ankle plantarflexion.  He has knee extension flexion strength intact.  No pain with hip logroll.  Evaluation of the right lower extremity is in the short leg splint.  Toes are warm and well-perfused.  Block remains therefore has numbness to  palpation about the toes as well as no motor function.   Assessment/Plan: 1 Day Post-Op Procedure(s) (LRB): ANKLE ARTHRODESIS (Right) HARDWARE REMOVAL (Right)  Patient has had experienced an exacerbation of his nerve compression after lengthy surgery yesterday in the supine position.  He has had some improvement since surgery and therefore we will continue to monitor this.  I discussed his case with Dr. Yevette Edwards who is following him for his disc herniation and he recommended a Medrol Dosepak initially.  We will continue to monitor for a couple weeks and if there is no improvement we will repeat an MRI scan of his lumbar spine to evaluate for progression of his herniation versus other cause for his worsening left lower extremity symptoms.  Throughout his right leg he remains nonweightbearing.  He will work with physical therapy and hopefully be discharged later today or tomorrow based on his ability to mobilize.  I have ordered a Medrol Dosepak which will start today.  He will complete his perioperative antibiotics Aspirin for DVT prophylaxis once discharged.  Hold chemoprophylaxis currently given bleeding risk.     Terance Hart 07/09/2018, 7:49 AM

## 2018-07-09 NOTE — Evaluation (Addendum)
Physical Therapy Evaluation Patient Details Name: Trevor Clarke MRN: [id number] DOB: 1986-04-12 Today's Date: 07/09/2018   History of Present Illness  Patient is a 33 y/o male s/p Clarke ankle hardware removal and ankle fusion on 07/08/18. Due to increased back pain and reduced sansation at L LE following surgery, patient admitted. PMH significant for prior back, cervical, ankle, pelvic, and tibial surgery.      Clinical Impression  Trevor Clarke admitted with the above listed diagnosis. Patient reports IND with all mobility and ADLs prior to surgery. Patient today requiring min guard for all OOB mobility for safety. Patient with increased back pain as well as reduced sensation and strength at Clarke foot/ankle currently limiting safe functional mobility - only able to ambulate ~15 feet before needing seated rest break. RW used for mobility for increased steadiness with patient preference for this device - will need to order this DME. PT to continue to follow acutely to maximize safe mobility.     Follow Up Recommendations No PT follow up;Supervision - Intermittent    Equipment Recommendations  Rolling walker with 5" wheels    Recommendations for Other Services       Precautions / Restrictions Precautions Precautions: Fall Restrictions Weight Bearing Restrictions: Yes RLE Weight Bearing: Non weight bearing      Mobility  Bed Mobility Overal bed mobility: Modified Independent                Transfers Overall transfer level: Needs assistance Equipment used: Rolling walker (2 wheeled) Transfers: Sit to/from Stand Sit to Stand: Min guard         General transfer comment: sit to stand x 2 from bedside with Min guard for safety and immediate standing balance; able to maintain NWB status  Ambulation/Gait Ambulation/Gait assistance: Min guard Gait Distance (Feet): 15 Feet Assistive device: Rolling walker (2 wheeled) Gait Pattern/deviations: Step-to pattern Gait velocity: decreased    General Gait Details: step to pattern wtih RW - patient with reduced sensation at forefoot stating he feels like he has a "peg-leg" ; limited due to back pain  Stairs            Wheelchair Mobility    Modified Rankin (Stroke Patients Only)       Balance Overall balance assessment: Mild deficits observed, not formally tested                                           Pertinent Vitals/Pain Pain Assessment: 0-10 Pain Score: 8  Pain Location: low back Pain Descriptors / Indicators: Aching;Discomfort;Grimacing;Guarding Pain Intervention(s): Limited activity within patient's tolerance;Monitored during session;Repositioned;Patient requesting pain meds-RN notified    Home Living Family/patient expects to be discharged to:: Private residence Living Arrangements: Spouse/significant other;Children Available Help at Discharge: Family;Available 24 hours/day Type of Home: House Home Access: Ramped entrance     Home Layout: Two level;Able to live on main level with bedroom/bathroom Home Equipment: Crutches;Shower seat;Transport chair(has grandparents close by with handicapped restroom)      Prior Function Level of Independence: Independent         Comments: worked full time     Higher education careers adviser        Extremity/Trunk Assessment   Upper Extremity Assessment Upper Extremity Assessment: Overall WFL for tasks assessed    Lower Extremity Assessment Lower Extremity Assessment: RLE deficits/detail;LLE deficits/detail RLE Deficits / Details: Clarke ankle grossly 3-/5 to 3/5; moving  ankle in all directions however unable to resist movement RLE Sensation: decreased light touch(on lateral aspect) LLE Deficits / Details: L LE in splint LLE: Unable to fully assess due to immobilization    Cervical / Trunk Assessment Cervical / Trunk Assessment: Normal  Communication   Communication: No difficulties  Cognition Arousal/Alertness: Awake/alert Behavior During  Therapy: WFL for tasks assessed/performed Overall Cognitive Status: Within Functional Limits for tasks assessed                                        General Comments General comments (skin integrity, edema, etc.): wife present and supportive    Exercises     Assessment/Plan    PT Assessment Patient needs continued PT services  PT Problem List Decreased strength;Decreased activity tolerance;Decreased balance;Decreased mobility;Decreased knowledge of use of DME;Decreased safety awareness       PT Treatment Interventions DME instruction;Gait training;Functional mobility training;Therapeutic activities;Therapeutic exercise;Balance training;Neuromuscular re-education;Patient/family education    PT Goals (Current goals can be found in the Care Plan section)  Acute Rehab PT Goals Patient Stated Goal: reduce pain PT Goal Formulation: With patient Time For Goal Achievement: 07/23/18 Potential to Achieve Goals: Good    Frequency Min 5X/week   Barriers to discharge        Co-evaluation               AM-PAC PT "6 Clicks" Mobility  Outcome Measure Help needed turning from your back to your side while in a flat bed without using bedrails?: None Help needed moving from lying on your back to sitting on the side of a flat bed without using bedrails?: None Help needed moving to and from a bed to a chair (including a wheelchair)?: A Little Help needed standing up from a chair using your arms (e.g., wheelchair or bedside chair)?: A Little Help needed to walk in hospital room?: A Little Help needed climbing 3-5 steps with a railing? : A Lot 6 Click Score: 19    End of Session Equipment Utilized During Treatment: Gait belt Activity Tolerance: Patient tolerated treatment well;Patient limited by pain Patient left: in chair;with call bell/phone within reach;with family/visitor present Nurse Communication: Mobility status PT Visit Diagnosis: Unsteadiness on feet  (R26.81);Other abnormalities of gait and mobility (R26.89);Muscle weakness (generalized) (M62.81);Pain Pain - part of body: (low back)    Time: [id number] PT Time Calculation (min) (ACUTE ONLY): 30 min   Charges:   PT Evaluation $PT Eval Moderate Complexity: 1 Mod        Kipp LaurenceStephanie Clarke , PT, DPT Supplemental Physical Therapist 07/09/18 10:18 AM Pager: [phone] Office: [phone]

## 2018-07-14 ENCOUNTER — Encounter (HOSPITAL_COMMUNITY): Payer: Self-pay | Admitting: Orthopaedic Surgery

## 2018-07-16 ENCOUNTER — Encounter (HOSPITAL_COMMUNITY): Payer: Self-pay | Admitting: Orthopaedic Surgery

## 2019-01-19 ENCOUNTER — Other Ambulatory Visit: Payer: Self-pay | Admitting: Orthopedic Surgery

## 2019-02-15 ENCOUNTER — Encounter (HOSPITAL_COMMUNITY)
Admission: RE | Admit: 2019-02-15 | Discharge: 2019-02-15 | Disposition: A | Discharge status: Home / Self Care | Payer: Worker's Compensation | Source: Ambulatory Visit | Attending: Orthopedic Surgery | Admitting: Orthopedic Surgery

## 2019-02-15 ENCOUNTER — Other Ambulatory Visit (HOSPITAL_COMMUNITY)
Admission: RE | Admit: 2019-02-15 | Discharge: 2019-02-15 | Disposition: A | Discharge status: Home / Self Care | Payer: 59 | Source: Ambulatory Visit | Attending: Orthopedic Surgery | Admitting: Orthopedic Surgery

## 2019-02-15 ENCOUNTER — Encounter (HOSPITAL_COMMUNITY): Payer: Self-pay

## 2019-02-15 ENCOUNTER — Other Ambulatory Visit: Payer: Self-pay

## 2019-02-15 DIAGNOSIS — Z01812 Encounter for preprocedural laboratory examination: Secondary | ICD-10-CM | POA: Insufficient documentation

## 2019-02-15 DIAGNOSIS — Z20828 Contact with and (suspected) exposure to other viral communicable diseases: Secondary | ICD-10-CM | POA: Insufficient documentation

## 2019-02-15 LAB — SURGICAL PCR SCREEN
MRSA, PCR: NEGATIVE
Staphylococcus aureus: NEGATIVE

## 2019-02-15 LAB — SARS CORONAVIRUS 2 (TAT 6-24 HRS): SARS Coronavirus 2: NEGATIVE

## 2019-02-15 LAB — CBC WITH DIFFERENTIAL/PLATELET
Abs Immature Granulocytes: 0.01 10*3/uL (ref 0.00–0.07)
Basophils Absolute: 0 10*3/uL (ref 0.0–0.1)
Basophils Relative: 0 %
Eosinophils Absolute: 0.2 10*3/uL (ref 0.0–0.5)
Eosinophils Relative: 3 %
HCT: 46 % (ref 39.0–52.0)
Hemoglobin: 14.8 g/dL (ref 13.0–17.0)
Immature Granulocytes: 0 %
Lymphocytes Relative: 48 %
Lymphs Abs: 2.7 10*3/uL (ref 0.7–4.0)
MCH: 28.1 pg (ref 26.0–34.0)
MCHC: 32.2 g/dL (ref 30.0–36.0)
MCV: 87.3 fL (ref 80.0–100.0)
Monocytes Absolute: 0.4 10*3/uL (ref 0.1–1.0)
Monocytes Relative: 8 %
Neutro Abs: 2.3 10*3/uL (ref 1.7–7.7)
Neutrophils Relative %: 41 %
Platelets: 232 10*3/uL (ref 150–400)
RBC: 5.27 MIL/uL (ref 4.22–5.81)
RDW: 13.6 % (ref 11.5–15.5)
WBC: 5.6 10*3/uL (ref 4.0–10.5)
nRBC: 0 % (ref 0.0–0.2)

## 2019-02-15 LAB — COMPREHENSIVE METABOLIC PANEL
ALT: 27 U/L (ref 0–44)
AST: 25 U/L (ref 15–41)
Albumin: 4 g/dL (ref 3.5–5.0)
Alkaline Phosphatase: 104 U/L (ref 38–126)
Anion gap: 10 (ref 5–15)
BUN: 14 mg/dL (ref 6–20)
CO2: 22 mmol/L (ref 22–32)
Calcium: 8.7 mg/dL — ABNORMAL LOW (ref 8.9–10.3)
Chloride: 105 mmol/L (ref 98–111)
Creatinine, Ser: 0.6 mg/dL — ABNORMAL LOW (ref 0.61–1.24)
GFR calc Af Amer: >60 mL/min (ref >60)
GFR calc non Af Amer: >60 mL/min (ref >60)
Glucose, Bld: 96 mg/dL (ref 70–99)
Potassium: 4 mmol/L (ref 3.5–5.1)
Sodium: 137 mmol/L (ref 135–145)
Total Bilirubin: 0.7 mg/dL (ref 0.3–1.2)
Total Protein: 6.9 g/dL (ref 6.5–8.1)

## 2019-02-15 LAB — URINALYSIS, ROUTINE W REFLEX MICROSCOPIC
Bilirubin Urine: NEGATIVE
Glucose, UA: NEGATIVE mg/dL
Hgb urine dipstick: NEGATIVE
Ketones, ur: NEGATIVE mg/dL
Leukocytes,Ua: NEGATIVE
Nitrite: NEGATIVE
Protein, ur: NEGATIVE mg/dL
Specific Gravity, Urine: 1.028 (ref 1.005–1.030)
pH: 6 (ref 5.0–8.0)

## 2019-02-15 LAB — TYPE AND SCREEN
ABO/RH(D): A POS
Antibody Screen: NEGATIVE

## 2019-02-15 LAB — ABO/RH: ABO/RH(D): A POS

## 2019-02-15 LAB — PROTIME-INR
INR: 1 (ref 0.8–1.2)
Prothrombin Time: 12.6 seconds (ref 11.4–15.2)

## 2019-02-15 LAB — APTT: aPTT: 29 seconds (ref 24–36)

## 2019-02-15 NOTE — Pre-Procedure Instructions (Signed)
Trevor Clarke  02/15/2019      Your procedure is scheduled on Wednesday, August 19  Report to [facility], Main Entrance or Entrance "A" at 9:00 A.M.- Dr's request   Call this number if you have problems the morning of surgery: [phone]  This is the number for the Pre- Surgical Desk.              For any other questions, please call [phone], Monday - Friday 8 AM - 4 PM.    Remember:  Do not eat after midnight Wednesday. August 18.  You may drink clear liquids until 9:00AM .  Clear liquids allowed are:  Water, Juice (non-citric and without pulp), Carbonated beverages, Clear Tea, Black Coffee only, Plain Jell-O only, Gatorade and Plain Popsicles only  Drink Ensure Pre- Surgery Beverage between 8:30 AM and 9:00 AM   Take these medicines the morning of surgery with A SIP OF WATER:  FLUoxetine (PROZAC)              gabapentin (NEURONTIN)  Take/use as needed: acetaminophen (TYLENOL), SYSTANE eye drops STOP taking Aspirin, Aspirin Products (Goody Powder, Excedrin Migraine), Ibuprofen (Advil), Naproxen (Aleve), Vitamins and Herbal Products (ie Fish Oil).  Special instructions:   [facility]- Preparing For Surgery  Before surgery, you can play an important role. Because skin is not sterile, your skin needs to be as free of germs as possible. You can reduce the number of germs on your skin by washing with CHG (chlorahexidine gluconate) Soap before surgery.  CHG is an antiseptic cleaner which kills germs and bonds with the skin to continue killing germs even after washing.    Oral Hygiene is also important to reduce your risk of infection.  Remember - BRUSH YOUR TEETH THE MORNING OF SURGERY WITH YOUR REGULAR TOOTHPASTE  Please do not use if you have an allergy to CHG or antibacterial soaps. If your skin becomes reddened/irritated stop using the CHG.  Do not shave (including legs and underarms) for at least 48 hours prior to first CHG shower. It is OK to shave your  face.  Please follow these instructions carefully.   1. Shower the NIGHT BEFORE SURGERY and the MORNING OF SURGERY with CHG.   2. If you chose to wash your hair, wash your hair first as usual with your normal shampoo.  3. After you shampoo, rinse your hair and body thoroughly to remove the shampoo.  4. Use CHG as you would any other liquid soap. You can apply CHG directly to the skin and wash gently with a scrungie or a clean washcloth.         5.   Apply the CHG Soap to your body ONLY FROM THE NECK DOWN.  Do not use on open wounds or open                    sores. Avoid contact with your eyes, ears, mouth and genitals (private parts).   6. Wash thoroughly, paying special attention to the area where your surgery will be performed.  7.Thoroughly rinse your body with warm water from the neck down.  8. DO NOT shower/wash with your normal soap after using and rinsing off the CHG Soap.   Pat yourself dry with a CLEAN TOWEL.  9.Wear CLEAN PAJAMAS to bed the night before surgery, wear comfortable clothes the morning of surgery  10.Place CLEAN SHEETS on your bed the night of your first shower and DO NOT SLEEP  WITH PETS.  Day of Surgery: Shower as instructed above.  Do not wear lotions, powders, or perfumes, or deodorant. Please wear clean clothes to the hospital/surgery center.   Remember to brush your teeth WITH YOUR REGULAR TOOTHPASTE.  Do not wear jewelry, make-up or nail polish.             Men may shave face and neck.  Do not bring valuables to the hospital.  [facility] any belongings or valuables.  Contacts, dentures or bridgework may not be worn into surgery.  Leave your suitcase in the car.  After surgery it may be brought to your room.  For patients admitted to the hospital, discharge time will be determined by your treatment team.  Patients discharged the day of surgery will not be allowed to drive home.   Please read over the following fact sheets  that you were given.: Pain Booklet, Incentive Spirometry, Surgical Site Infections.

## 2019-02-15 NOTE — Pre-Procedure Instructions (Signed)
Trevor Clarke  02/15/2019      Your procedure is scheduled on Wednesday, August 19  Report to [facility], Main Entrance or Entrance "A" at 9:00 A.M.- Dr's request   Call this number if you have problems the morning of surgery: [phone]  This is the number for the Pre- Surgical Desk.              For any other questions, please call [phone], Monday - Friday 8 AM - 4 PM.    Remember:  Do not eat after midnight Wednesday. August 18.  You may drink clear liquids until 9:00AM .  Clear liquids allowed are:  Water, Juice (non-citric and without pulp), Carbonated beverages, Clear Tea, Black Coffee only, Plain Jell-O only, Gatorade and Plain Popsicles only  Drink Ensure Pre- Surgery Beverage between 8:30 AM and 9:00 AM Patient Instructions  . The night before surgery:  o No food after midnight. ONLY clear liquids after midnight  . The day of surgery (if you do NOT have diabetes):  o Drink ONE (1) Pre-Surgery Clear Ensure as directed.   o This drink was given to you during your hospital  pre-op appointment visit. o The pre-op nurse will instruct you on the time to drink the  Pre-Surgery Ensure depending on your surgery time (finish by 9AM) o Finish the drink at the designated time by the pre-op nurse.  o Nothing else to drink after completing the  Pre-Surgery Clear Ensure.         If you have questions, please contact your surgeon's office.    Take these medicines the morning of surgery with A SIP OF WATER:  FLUoxetine (PROZAC)              gabapentin (NEURONTIN)  Take/use as needed: acetaminophen (TYLENOL), SYSTANE eye drops STOP taking Aspirin, Aspirin Products (Goody Powder, Excedrin Migraine), Ibuprofen (Advil), Naproxen (Aleve), Vitamins and Herbal Products (ie Fish Oil).  Special instructions:   [place]- Preparing For Surgery  Before surgery, you can play an important role. Because skin is not sterile, your skin needs to be as free of germs as  possible. You can reduce the number of germs on your skin by washing with CHG (chlorahexidine gluconate) Soap before surgery.  CHG is an antiseptic cleaner which kills germs and bonds with the skin to continue killing germs even after washing.    Oral Hygiene is also important to reduce your risk of infection.  Remember - BRUSH YOUR TEETH THE MORNING OF SURGERY WITH YOUR REGULAR TOOTHPASTE  Please do not use if you have an allergy to CHG or antibacterial soaps. If your skin becomes reddened/irritated stop using the CHG.  Do not shave (including legs and underarms) for at least 48 hours prior to first CHG shower. It is OK to shave your face.  Please follow these instructions carefully.   1. Shower the NIGHT BEFORE SURGERY and the MORNING OF SURGERY with CHG.   2. If you chose to wash your hair, wash your hair first as usual with your normal shampoo.  3. After you shampoo, rinse your hair and body thoroughly to remove the shampoo.  4. Use CHG as you would any other liquid soap. You can apply CHG directly to the skin and wash gently with a scrungie or a clean washcloth.         5.   Apply the CHG Soap to your body ONLY FROM THE NECK DOWN.  Do not use on  open wounds or open                    sores. Avoid contact with your eyes, ears, mouth and genitals (private parts).   6. Wash thoroughly, paying special attention to the area where your surgery will be performed.  7.Thoroughly rinse your body with warm water from the neck down.  8. DO NOT shower/wash with your normal soap after using and rinsing off the CHG Soap.   Pat yourself dry with a CLEAN TOWEL.  9.Wear CLEAN PAJAMAS to bed the night before surgery, wear comfortable clothes the morning of surgery  10.Place CLEAN SHEETS on your bed the night of your first shower and DO NOT SLEEP WITH PETS.  Day of Surgery: Shower as instructed above.  Do not wear lotions, powders, or perfumes, or deodorant. Please wear clean clothes to the  hospital/surgery center.   Remember to brush your teeth WITH YOUR REGULAR TOOTHPASTE.  Do not wear jewelry, make-up or nail polish.             Men may shave face and neck.  Do not bring valuables to the hospital.  [facility] is not responsible for any belongings or valuables.  Contacts, dentures or bridgework may not be worn into surgery.  Leave your suitcase in the car.  After surgery it may be brought to your room.  For patients admitted to the hospital, discharge time will be determined by your treatment team.  Patients discharged the day of surgery will not be allowed to drive home.   Please read over the following fact sheets that you were given.: Pain Booklet, Incentive Spirometry, Surgical Site Infections.

## 2019-02-15 NOTE — Progress Notes (Signed)
PCP - Renaldo Reel Cardiologist - denies  Chest x-ray - N/A EKG - N/A Stress Test - denies ECHO - denies Cardiac Cath - denies  Sleep Study - denies   Patient instructed to hold all Aspirin, NSAID's, herbal medications, fish oil and vitamins 7 days prior to surgery. Patient states last dose of Naproxen was 02/11/19   Anesthesia review:   Patient denies shortness of breath, fever, cough and chest pain at PAT appointment   Patient verbalized understanding of instructions that were given to them at the PAT appointment. Patient was also instructed that they will need to review over the PAT instructions again at home before surgery.

## 2019-02-16 MED ORDER — VANCOMYCIN HCL 10 G IV SOLR
1500.0000 mg | INTRAVENOUS | Status: AC
  Administered 2019-02-17: 1500 mg via INTRAVENOUS
  Filled 2019-02-16 (×2): qty 1500

## 2019-02-17 ENCOUNTER — Inpatient Hospital Stay (HOSPITAL_COMMUNITY)
Admission: RE | Admit: 2019-02-17 | Discharge: 2019-02-18 | DRG: 455 | Disposition: A | Discharge status: Home / Self Care | Payer: Worker's Compensation | Attending: Orthopedic Surgery | Admitting: Orthopedic Surgery

## 2019-02-17 ENCOUNTER — Inpatient Hospital Stay (HOSPITAL_COMMUNITY): Payer: Worker's Compensation | Admitting: Anesthesiology

## 2019-02-17 ENCOUNTER — Inpatient Hospital Stay (HOSPITAL_COMMUNITY): Payer: Worker's Compensation

## 2019-02-17 ENCOUNTER — Encounter (HOSPITAL_COMMUNITY)
Admission: RE | Disposition: A | Discharge status: Home / Self Care | Payer: Self-pay | Source: Home / Self Care | Attending: Orthopedic Surgery

## 2019-02-17 ENCOUNTER — Encounter (HOSPITAL_COMMUNITY): Payer: Self-pay

## 2019-02-17 ENCOUNTER — Other Ambulatory Visit: Payer: Self-pay

## 2019-02-17 DIAGNOSIS — Z87891 Personal history of nicotine dependence: Secondary | ICD-10-CM | POA: Diagnosis not present

## 2019-02-17 DIAGNOSIS — M5116 Intervertebral disc disorders with radiculopathy, lumbar region: Principal | ICD-10-CM | POA: Diagnosis present

## 2019-02-17 DIAGNOSIS — Z981 Arthrodesis status: Secondary | ICD-10-CM | POA: Diagnosis not present

## 2019-02-17 DIAGNOSIS — Z20828 Contact with and (suspected) exposure to other viral communicable diseases: Secondary | ICD-10-CM | POA: Diagnosis present

## 2019-02-17 DIAGNOSIS — M5410 Radiculopathy, site unspecified: Secondary | ICD-10-CM

## 2019-02-17 DIAGNOSIS — M5117 Intervertebral disc disorders with radiculopathy, lumbosacral region: Secondary | ICD-10-CM | POA: Diagnosis present

## 2019-02-17 DIAGNOSIS — Z419 Encounter for procedure for purposes other than remedying health state, unspecified: Secondary | ICD-10-CM

## 2019-02-17 DIAGNOSIS — G8929 Other chronic pain: Secondary | ICD-10-CM | POA: Diagnosis present

## 2019-02-17 SURGERY — POSTERIOR LUMBAR FUSION 1 LEVEL
Anesthesia: General | Site: Spine Lumbar | Laterality: Left

## 2019-02-17 MED ORDER — THROMBIN 20000 UNITS EX SOLR
CUTANEOUS | Status: AC
  Filled 2019-02-17: qty 20000

## 2019-02-17 MED ORDER — 0.9 % SODIUM CHLORIDE (POUR BTL) OPTIME
TOPICAL | Status: DC | PRN
  Administered 2019-02-17 (×3): 1000 mL

## 2019-02-17 MED ORDER — SUCCINYLCHOLINE CHLORIDE 200 MG/10ML IV SOSY
PREFILLED_SYRINGE | INTRAVENOUS | Status: AC
  Filled 2019-02-17: qty 20

## 2019-02-17 MED ORDER — LIDOCAINE 2% (20 MG/ML) 5 ML SYRINGE
INTRAMUSCULAR | Status: AC
  Filled 2019-02-17: qty 5

## 2019-02-17 MED ORDER — ATOMOXETINE HCL 10 MG PO CAPS
10.0000 mg | ORAL_CAPSULE | Freq: Every day | ORAL | Status: DC
  Filled 2019-02-17: qty 1

## 2019-02-17 MED ORDER — BUPIVACAINE LIPOSOME 1.3 % IJ SUSP
20.0000 mL | INTRAMUSCULAR | Status: DC
  Filled 2019-02-17: qty 20

## 2019-02-17 MED ORDER — SODIUM CHLORIDE 0.9% FLUSH: 3.0000 mL | INTRAVENOUS | Status: DC | PRN

## 2019-02-17 MED ORDER — MENTHOL 3 MG MT LOZG: 1.0000 | LOZENGE | OROMUCOSAL | Status: DC | PRN

## 2019-02-17 MED ORDER — POVIDONE-IODINE 7.5 % EX SOLN
Freq: Once | CUTANEOUS | Status: DC
  Filled 2019-02-17: qty 118

## 2019-02-17 MED ORDER — BUPIVACAINE LIPOSOME 1.3 % IJ SUSP
INTRAMUSCULAR | Status: DC | PRN
  Administered 2019-02-17: 20 mL

## 2019-02-17 MED ORDER — THROMBIN 20000 UNITS EX SOLR
CUTANEOUS | Status: DC | PRN
  Administered 2019-02-17: 12:00:00 20 mL via TOPICAL

## 2019-02-17 MED ORDER — MEPERIDINE HCL 25 MG/ML IJ SOLN: 6.2500 mg | INTRAMUSCULAR | Status: DC | PRN

## 2019-02-17 MED ORDER — VITAMIN C 250 MG PO TABS
240.0000 mg | ORAL_TABLET | Freq: Every day | ORAL | Status: DC
  Filled 2019-02-17: qty 1

## 2019-02-17 MED ORDER — PROPOFOL 10 MG/ML IV BOLUS
INTRAVENOUS | Status: DC | PRN
  Administered 2019-02-17: 150 mg via INTRAVENOUS

## 2019-02-17 MED ORDER — BISACODYL 5 MG PO TBEC: 5.0000 mg | DELAYED_RELEASE_TABLET | Freq: Every day | ORAL | Status: DC | PRN

## 2019-02-17 MED ORDER — ONDANSETRON HCL 4 MG/2ML IJ SOLN
INTRAMUSCULAR | Status: DC | PRN
  Administered 2019-02-17: 4 mg via INTRAVENOUS

## 2019-02-17 MED ORDER — LIDOCAINE 2% (20 MG/ML) 5 ML SYRINGE
INTRAMUSCULAR | Status: DC | PRN
  Administered 2019-02-17: 100 mg via INTRAVENOUS

## 2019-02-17 MED ORDER — FLUOXETINE HCL 20 MG PO CAPS: 20.0000 mg | ORAL_CAPSULE | Freq: Every day | ORAL | Status: DC

## 2019-02-17 MED ORDER — SUGAMMADEX SODIUM 200 MG/2ML IV SOLN
INTRAVENOUS | Status: DC | PRN
  Administered 2019-02-17: 252.2 mg via INTRAVENOUS

## 2019-02-17 MED ORDER — FENTANYL CITRATE (PF) 250 MCG/5ML IJ SOLN
INTRAMUSCULAR | Status: AC
  Filled 2019-02-17: qty 5

## 2019-02-17 MED ORDER — TRAMADOL HCL 50 MG PO TABS: 50.0000 mg | ORAL_TABLET | Freq: Four times a day (QID) | ORAL | Status: DC

## 2019-02-17 MED ORDER — TRAMADOL HCL 50 MG PO TABS
50.0000 mg | ORAL_TABLET | Freq: Four times a day (QID) | ORAL | Status: DC | PRN
  Administered 2019-02-17 – 2019-02-18 (×3): 100 mg via ORAL
  Filled 2019-02-17 (×3): qty 2

## 2019-02-17 MED ORDER — ALUM & MAG HYDROXIDE-SIMETH 200-200-20 MG/5ML PO SUSP: 30.0000 mL | Freq: Four times a day (QID) | ORAL | Status: DC | PRN

## 2019-02-17 MED ORDER — HYDROMORPHONE HCL 1 MG/ML IJ SOLN
INTRAMUSCULAR | Status: AC
  Filled 2019-02-17: qty 1

## 2019-02-17 MED ORDER — ONDANSETRON HCL 4 MG PO TABS: 4.0000 mg | ORAL_TABLET | Freq: Four times a day (QID) | ORAL | Status: DC | PRN

## 2019-02-17 MED ORDER — ACETAMINOPHEN 325 MG PO TABS
650.0000 mg | ORAL_TABLET | ORAL | Status: DC | PRN
  Administered 2019-02-18 (×2): 650 mg via ORAL
  Filled 2019-02-17 (×2): qty 2

## 2019-02-17 MED ORDER — DEXAMETHASONE SODIUM PHOSPHATE 10 MG/ML IJ SOLN
INTRAMUSCULAR | Status: DC | PRN
  Administered 2019-02-17: 10 mg via INTRAVENOUS

## 2019-02-17 MED ORDER — SUCCINYLCHOLINE CHLORIDE 20 MG/ML IJ SOLN
INTRAMUSCULAR | Status: DC | PRN
  Administered 2019-02-17: 200 mg via INTRAVENOUS

## 2019-02-17 MED ORDER — SENNOSIDES-DOCUSATE SODIUM 8.6-50 MG PO TABS: 1.0000 | ORAL_TABLET | Freq: Every evening | ORAL | Status: DC | PRN

## 2019-02-17 MED ORDER — ROCURONIUM BROMIDE 10 MG/ML (PF) SYRINGE
PREFILLED_SYRINGE | INTRAVENOUS | Status: AC
  Filled 2019-02-17: qty 10

## 2019-02-17 MED ORDER — MELATONIN 5 MG PO CAPS: 10.0000 mg | ORAL_CAPSULE | Freq: Every day | ORAL | Status: DC

## 2019-02-17 MED ORDER — PROPOFOL 500 MG/50ML IV EMUL
INTRAVENOUS | Status: DC | PRN
  Administered 2019-02-17: 13:00:00 via INTRAVENOUS
  Administered 2019-02-17: 75 ug/kg/min via INTRAVENOUS

## 2019-02-17 MED ORDER — MORPHINE SULFATE (PF) 2 MG/ML IV SOLN: 1.0000 mg | INTRAVENOUS | Status: DC | PRN

## 2019-02-17 MED ORDER — BUPIVACAINE-EPINEPHRINE 0.25% -1:200000 IJ SOLN
INTRAMUSCULAR | Status: DC | PRN
  Administered 2019-02-17: 10 mL
  Administered 2019-02-17: 20 mL

## 2019-02-17 MED ORDER — ONDANSETRON HCL 4 MG/2ML IJ SOLN: 4.0000 mg | Freq: Four times a day (QID) | INTRAMUSCULAR | Status: DC | PRN

## 2019-02-17 MED ORDER — MIDAZOLAM HCL 2 MG/2ML IJ SOLN
INTRAMUSCULAR | Status: AC
  Filled 2019-02-17: qty 2

## 2019-02-17 MED ORDER — POTASSIUM CHLORIDE IN NACL 20-0.9 MEQ/L-% IV SOLN: INTRAVENOUS | Status: DC

## 2019-02-17 MED ORDER — PHENYLEPHRINE 40 MCG/ML (10ML) SYRINGE FOR IV PUSH (FOR BLOOD PRESSURE SUPPORT)
PREFILLED_SYRINGE | INTRAVENOUS | Status: AC
  Filled 2019-02-17: qty 10

## 2019-02-17 MED ORDER — PHENYLEPHRINE 40 MCG/ML (10ML) SYRINGE FOR IV PUSH (FOR BLOOD PRESSURE SUPPORT)
PREFILLED_SYRINGE | INTRAVENOUS | Status: DC | PRN
  Administered 2019-02-17 (×3): 80 ug via INTRAVENOUS
  Administered 2019-02-17: 20 ug via INTRAVENOUS
  Administered 2019-02-17: 60 ug via INTRAVENOUS
  Administered 2019-02-17 (×2): 80 ug via INTRAVENOUS

## 2019-02-17 MED ORDER — LACTATED RINGERS IV SOLN
INTRAVENOUS | Status: DC
  Administered 2019-02-17 (×3): via INTRAVENOUS

## 2019-02-17 MED ORDER — POLYETHYL GLYCOL-PROPYL GLYCOL 0.4-0.3 % OP SOLN: 1.0000 [drp] | Freq: Every day | OPHTHALMIC | Status: DC | PRN

## 2019-02-17 MED ORDER — DOCUSATE SODIUM 100 MG PO CAPS
100.0000 mg | ORAL_CAPSULE | Freq: Two times a day (BID) | ORAL | Status: DC
  Administered 2019-02-17: 21:00:00 100 mg via ORAL
  Filled 2019-02-17: qty 1

## 2019-02-17 MED ORDER — SODIUM CHLORIDE 0.9% FLUSH
3.0000 mL | Freq: Two times a day (BID) | INTRAVENOUS | Status: DC
  Administered 2019-02-17: 3 mL via INTRAVENOUS

## 2019-02-17 MED ORDER — FLEET ENEMA 7-19 GM/118ML RE ENEM: 1.0000 | ENEMA | Freq: Once | RECTAL | Status: DC | PRN

## 2019-02-17 MED ORDER — BUPIVACAINE-EPINEPHRINE (PF) 0.25% -1:200000 IJ SOLN
INTRAMUSCULAR | Status: AC
  Filled 2019-02-17: qty 30

## 2019-02-17 MED ORDER — ONDANSETRON HCL 4 MG/2ML IJ SOLN: 4.0000 mg | Freq: Once | INTRAMUSCULAR | Status: DC | PRN

## 2019-02-17 MED ORDER — MIDAZOLAM HCL 5 MG/5ML IJ SOLN
INTRAMUSCULAR | Status: DC | PRN
  Administered 2019-02-17: 2 mg via INTRAVENOUS

## 2019-02-17 MED ORDER — ZOLPIDEM TARTRATE 5 MG PO TABS: 5.0000 mg | ORAL_TABLET | Freq: Every evening | ORAL | Status: DC | PRN

## 2019-02-17 MED ORDER — DIAZEPAM 5 MG PO TABS
ORAL_TABLET | ORAL | Status: AC
  Filled 2019-02-17: qty 1

## 2019-02-17 MED ORDER — VANCOMYCIN HCL 10 G IV SOLR
1500.0000 mg | Freq: Once | INTRAVENOUS | Status: AC
  Administered 2019-02-17: 1500 mg via INTRAVENOUS
  Filled 2019-02-17: qty 1500

## 2019-02-17 MED ORDER — PHENOL 1.4 % MT LIQD: 1.0000 | OROMUCOSAL | Status: DC | PRN

## 2019-02-17 MED ORDER — FENTANYL CITRATE (PF) 250 MCG/5ML IJ SOLN
INTRAMUSCULAR | Status: DC | PRN
  Administered 2019-02-17 (×2): 100 ug via INTRAVENOUS
  Administered 2019-02-17: 50 ug via INTRAVENOUS
  Administered 2019-02-17 (×2): 100 ug via INTRAVENOUS
  Administered 2019-02-17: 50 ug via INTRAVENOUS

## 2019-02-17 MED ORDER — DEXMEDETOMIDINE HCL 200 MCG/2ML IV SOLN
INTRAVENOUS | Status: DC | PRN
  Administered 2019-02-17: 12 ug via INTRAVENOUS
  Administered 2019-02-17: 8 ug via INTRAVENOUS
  Administered 2019-02-17: 12 ug via INTRAVENOUS

## 2019-02-17 MED ORDER — SODIUM CHLORIDE 0.9 % IV SOLN: 250.0000 mL | INTRAVENOUS | Status: DC

## 2019-02-17 MED ORDER — METHYLENE BLUE 0.5 % INJ SOLN
INTRAVENOUS | Status: AC
  Filled 2019-02-17: qty 10

## 2019-02-17 MED ORDER — OXYCODONE-ACETAMINOPHEN 5-325 MG PO TABS
1.0000 | ORAL_TABLET | ORAL | Status: DC | PRN
  Administered 2019-02-17: 2 via ORAL
  Filled 2019-02-17: qty 2

## 2019-02-17 MED ORDER — PROPOFOL 10 MG/ML IV BOLUS
INTRAVENOUS | Status: AC
  Filled 2019-02-17: qty 20

## 2019-02-17 MED ORDER — DEXAMETHASONE SODIUM PHOSPHATE 10 MG/ML IJ SOLN
INTRAMUSCULAR | Status: AC
  Filled 2019-02-17: qty 1

## 2019-02-17 MED ORDER — ROCURONIUM BROMIDE 10 MG/ML (PF) SYRINGE
PREFILLED_SYRINGE | INTRAVENOUS | Status: DC | PRN
  Administered 2019-02-17: 30 mg via INTRAVENOUS
  Administered 2019-02-17: 40 mg via INTRAVENOUS
  Administered 2019-02-17: 30 mg via INTRAVENOUS

## 2019-02-17 MED ORDER — ACETAMINOPHEN 650 MG RE SUPP: 650.0000 mg | RECTAL | Status: DC | PRN

## 2019-02-17 MED ORDER — HYDROMORPHONE HCL 1 MG/ML IJ SOLN
0.2500 mg | INTRAMUSCULAR | Status: DC | PRN
  Administered 2019-02-17 (×4): 0.5 mg via INTRAVENOUS

## 2019-02-17 MED ORDER — DIAZEPAM 5 MG PO TABS
5.0000 mg | ORAL_TABLET | Freq: Four times a day (QID) | ORAL | Status: DC | PRN
  Administered 2019-02-17 – 2019-02-18 (×3): 5 mg via ORAL
  Filled 2019-02-17 (×2): qty 1

## 2019-02-17 SURGICAL SUPPLY — 94 items
BENZOIN TINCTURE PRP APPL 2/3 (GAUZE/BANDAGES/DRESSINGS) ×3 IMPLANT
BLADE CLIPPER SURG (BLADE) IMPLANT
BONE VIVIGEN FORMABLE 10CC (Bone Implant) ×3 IMPLANT
BUR PRESCISION 1.7 ELITE (BURR) ×3 IMPLANT
BUR ROUND FLUTED 5 RND (BURR) ×2 IMPLANT
BUR ROUND FLUTED 5MM RND (BURR) ×1
BUR ROUND PRECISION 4.0 (BURR) IMPLANT
BUR ROUND PRECISION 4.0MM (BURR)
BUR SABER RD CUTTING 3.0 (BURR) IMPLANT
BUR SABER RD CUTTING 3.0MM (BURR)
CAGE CONCORDE BULLET 9X11X23 (Cage) ×2 IMPLANT
CAGE CONCORDE BULLET 9X11X27 (Cage) ×1 IMPLANT
CAGE CONCORDE BULLET 9X11X27MM (Cage) ×1 IMPLANT
CAGE SPNL 5D BLT NOSE 23X9X11 (Cage) ×1 IMPLANT
CAGE SPNL 5D BLT NOSE 27X9X11 (Cage) ×1 IMPLANT
CARTRIDGE OIL MAESTRO DRILL (MISCELLANEOUS) ×1 IMPLANT
CLOSURE WOUND 1/2 X4 (GAUZE/BANDAGES/DRESSINGS) ×1
CONT SPEC 4OZ CLIKSEAL STRL BL (MISCELLANEOUS) ×9 IMPLANT
COVER MAYO STAND STRL (DRAPES) IMPLANT
COVER SURGICAL LIGHT HANDLE (MISCELLANEOUS) IMPLANT
COVER WAND RF STERILE (DRAPES) IMPLANT
DIFFUSER DRILL AIR PNEUMATIC (MISCELLANEOUS) ×3 IMPLANT
DRAIN CHANNEL 15F RND FF W/TCR (WOUND CARE) IMPLANT
DRAPE C-ARM 42X72 X-RAY (DRAPES) ×3 IMPLANT
DRAPE C-ARMOR (DRAPES) ×3 IMPLANT
DRAPE POUCH INSTRU U-SHP 10X18 (DRAPES) IMPLANT
DRAPE SURG 17X23 STRL (DRAPES) ×12 IMPLANT
DURAPREP 26ML APPLICATOR (WOUND CARE) ×3 IMPLANT
ELECT BLADE 4.0 EZ CLEAN MEGAD (MISCELLANEOUS) ×3
ELECT CAUTERY BLADE 6.4 (BLADE) ×3 IMPLANT
ELECT REM PT RETURN 9FT ADLT (ELECTROSURGICAL) ×3
ELECTRODE BLDE 4.0 EZ CLN MEGD (MISCELLANEOUS) ×1 IMPLANT
ELECTRODE REM PT RTRN 9FT ADLT (ELECTROSURGICAL) ×1 IMPLANT
EVACUATOR SILICONE 100CC (DRAIN) IMPLANT
FEE INTRAOP MONITOR IMPULS NCS (MISCELLANEOUS) ×1 IMPLANT
FILTER STRAW FLUID ASPIR (MISCELLANEOUS) ×3 IMPLANT
GAUZE 4X4 16PLY RFD (DISPOSABLE) IMPLANT
GAUZE SPONGE 4X4 12PLY STRL (GAUZE/BANDAGES/DRESSINGS) ×3 IMPLANT
GLOVE BIO SURGEON STRL SZ7 (GLOVE) ×9 IMPLANT
GLOVE BIO SURGEON STRL SZ8 (GLOVE) ×3 IMPLANT
GLOVE BIOGEL PI IND STRL 6.5 (GLOVE) ×2 IMPLANT
GLOVE BIOGEL PI IND STRL 7.0 (GLOVE) ×1 IMPLANT
GLOVE BIOGEL PI IND STRL 8 (GLOVE) ×1 IMPLANT
GLOVE BIOGEL PI INDICATOR 6.5 (GLOVE) ×4
GLOVE BIOGEL PI INDICATOR 7.0 (GLOVE) ×2
GLOVE BIOGEL PI INDICATOR 8 (GLOVE) ×2
GLOVE SURG SS PI 6.0 STRL IVOR (GLOVE) ×12 IMPLANT
GLOVE SURG SS PI 7.5 STRL IVOR (GLOVE) ×6 IMPLANT
GOWN STRL REUS W/ TWL LRG LVL3 (GOWN DISPOSABLE) ×4 IMPLANT
GOWN STRL REUS W/ TWL XL LVL3 (GOWN DISPOSABLE) ×1 IMPLANT
GOWN STRL REUS W/TWL LRG LVL3 (GOWN DISPOSABLE) ×8
GOWN STRL REUS W/TWL XL LVL3 (GOWN DISPOSABLE) ×2
INTRAOP MONITOR FEE IMPULS NCS (MISCELLANEOUS) ×1
INTRAOP MONITOR FEE IMPULSE (MISCELLANEOUS) ×2
IV CATH 14GX2 1/4 (CATHETERS) ×3 IMPLANT
KIT BASIN OR (CUSTOM PROCEDURE TRAY) ×3 IMPLANT
KIT POSITION SURG JACKSON T1 (MISCELLANEOUS) ×3 IMPLANT
KIT TURNOVER KIT B (KITS) ×3 IMPLANT
MARKER SKIN DUAL TIP RULER LAB (MISCELLANEOUS) ×6 IMPLANT
NEEDLE 18GX1X1/2 (RX/OR ONLY) (NEEDLE) IMPLANT
NEEDLE 22X1 1/2 (OR ONLY) (NEEDLE) ×3 IMPLANT
NEEDLE HYPO 25GX1X1/2 BEV (NEEDLE) ×3 IMPLANT
NEEDLE SPNL 18GX3.5 QUINCKE PK (NEEDLE) ×6 IMPLANT
NS IRRIG 1000ML POUR BTL (IV SOLUTION) ×3 IMPLANT
OIL CARTRIDGE MAESTRO DRILL (MISCELLANEOUS) ×3
PACK LAMINECTOMY NEURO (CUSTOM PROCEDURE TRAY) ×3 IMPLANT
PACK UNIVERSAL I (CUSTOM PROCEDURE TRAY) ×3 IMPLANT
PAD ARMBOARD 7.5X6 YLW CONV (MISCELLANEOUS) ×6 IMPLANT
PATTIES SURGICAL .5 X1 (DISPOSABLE) IMPLANT
PATTIES SURGICAL .5X1.5 (GAUZE/BANDAGES/DRESSINGS) IMPLANT
PROBE PEDCLE PROBE MAGSTM DISP (MISCELLANEOUS) ×3 IMPLANT
ROD PRE BENT EXP 40MM (Rod) ×6 IMPLANT
SCREW SET SINGLE INNER (Screw) ×12 IMPLANT
SCREW VIPER CORT FIX 6.00X30 (Screw) ×3 IMPLANT
SCREW VIPER CORT FIX 6X35 (Screw) ×3 IMPLANT
SCREW VIPER CORTICAL FIX 6X40 (Screw) ×6 IMPLANT
SPONGE INTESTINAL PEANUT (DISPOSABLE) ×3 IMPLANT
SPONGE SURGIFOAM ABS GEL 100 (HEMOSTASIS) ×3 IMPLANT
STRIP CLOSURE SKIN 1/2X4 (GAUZE/BANDAGES/DRESSINGS) ×2 IMPLANT
SURGIFLO W/THROMBIN 8M KIT (HEMOSTASIS) IMPLANT
SUT MNCRL AB 4-0 PS2 18 (SUTURE) ×3 IMPLANT
SUT VIC AB 0 CT1 18XCR BRD 8 (SUTURE) ×1 IMPLANT
SUT VIC AB 0 CT1 8-18 (SUTURE) ×2
SUT VIC AB 1 CT1 18XCR BRD 8 (SUTURE) ×1 IMPLANT
SUT VIC AB 1 CT1 8-18 (SUTURE) ×2
SUT VIC AB 2-0 CT2 18 VCP726D (SUTURE) ×6 IMPLANT
SYR 20ML LL LF (SYRINGE) IMPLANT
SYR BULB IRRIGATION 50ML (SYRINGE) IMPLANT
SYR CONTROL 10ML LL (SYRINGE) ×6 IMPLANT
SYR TB 1ML LUER SLIP (SYRINGE) ×3 IMPLANT
TAPE CLOTH SURG 4X10 WHT LF (GAUZE/BANDAGES/DRESSINGS) ×3 IMPLANT
TRAY FOLEY MTR SLVR 16FR STAT (SET/KITS/TRAYS/PACK) ×3 IMPLANT
WATER STERILE IRR 1000ML POUR (IV SOLUTION) ×3 IMPLANT
YANKAUER SUCT BULB TIP NO VENT (SUCTIONS) ×3 IMPLANT

## 2019-02-17 NOTE — Anesthesia Procedure Notes (Signed)
Procedure Name: Intubation Date/Time: 02/17/2019 11:24 AM Performed by: Mariea Clonts, CRNA Pre-anesthesia Checklist: Patient identified, Emergency Drugs available, Suction available and Patient being monitored Patient Re-evaluated:Patient Re-evaluated prior to induction Oxygen Delivery Method: Circle System Utilized Preoxygenation: Pre-oxygenation with 100% oxygen Induction Type: IV induction Ventilation: Mask ventilation without difficulty Laryngoscope Size: Mac and 4 Grade View: Grade I Tube type: Oral Tube size: 8.0 mm Number of attempts: 1 Airway Equipment and Method: Stylet and Oral airway Placement Confirmation: ETT inserted through vocal cords under direct vision,  positive ETCO2 and breath sounds checked- equal and bilateral Tube secured with: Tape Dental Injury: Teeth and Oropharynx as per pre-operative assessment

## 2019-02-17 NOTE — H&P (Signed)
PREOPERATIVE H&P  Chief Complaint: Left leg pain  HPI: Trevor Clarke is a 33 y.o. male who presents with ongoing pain in the left leg  MRI reveals a recurrent left L5/S1 HNP  Patient has failed multiple forms of conservative care and continues to have pain (see office notes for additional details regarding the patient's full course of treatment)  Past Medical History:  Diagnosis Date  . Chronic back pain   . Medical history non-contributory    Past Surgical History:  Procedure Laterality Date  . ANKLE ARTHRODESIS Right 07/08/2018  . ANKLE FUSION Right 07/08/2018   Procedure: ANKLE ARTHRODESIS;  Surgeon: Erle Crocker, MD;  Location: [facility];  Service: Orthopedics;  Laterality: Right;  . BACK SURGERY     L5-S1 microdiscectomy  . CERVICAL SPINE SURGERY  2016  . FRACTURE SURGERY Right 2003   upper arm  . HARDWARE REMOVAL Right 07/08/2018   Procedure: HARDWARE REMOVAL;  Surgeon: Erle Crocker, MD;  Location: [place];  Service: Orthopedics;  Laterality: Right;  . NOSE SURGERY Right    reattached nostril  . ORIF TIBIA & FIBULA FRACTURES Right 10/1998  . PELVIC FRACTURE SURGERY Right 2003  . TIBIA FRACTURE SURGERY Left 2016   Social History   Socioeconomic History  . Marital status: Married    Spouse name: Not on file  . Number of children: Not on file  . Years of education: Not on file  . Highest education level: Not on file  Occupational History  . Not on file  Social Needs  . Financial resource strain: Not on file  . Food insecurity    Worry: Not on file    Inability: Not on file  . Transportation needs    Medical: Not on file    Non-medical: Not on file  Tobacco Use  . Smoking status: Former Smoker    Quit date: 07/16/2017    Years since quitting: 1.5  . Smokeless tobacco: Never Used  Substance and Sexual Activity  . Alcohol use: Not Currently    Comment: social; 2 beers a week  . Drug use: Never  . Sexual activity: Not on file  Lifestyle  .  Physical activity    Days per week: Not on file    Minutes per session: Not on file  . Stress: Not on file  Relationships  . Social Herbalist on phone: Not on file    Gets together: Not on file    Attends religious service: Not on file    Active member of club or organization: Not on file    Attends meetings of clubs or organizations: Not on file    Relationship status: Not on file  Other Topics Concern  . Not on file  Social History Narrative  . Not on file   No family history on file. Allergies  Allergen Reactions  . Penicillins Other (See Comments)    Childhood allergy, found out allergy when IV was taken out at hospital Did it involve swelling of the face/tongue/throat, SOB, or low BP? Unknown Did it involve sudden or severe rash/hives, skin peeling, or any reaction on the inside of your mouth or nose? Unknown Did you need to seek medical attention at a hospital or doctor's office? Unknown When did it last happen?Childhood allergy If all above answers are "NO", may proceed with cephalosporin use.    Prior to Admission medications   Medication Sig Start Date End Date Taking? Authorizing Provider  acetaminophen (TYLENOL) 500 MG tablet Take 1,000 mg by mouth 2 (two) times daily.   Yes [provider]  Ascorbic Acid (VITAMIN C PO) Take 240 mg by mouth daily.   Yes [provider]  atomoxetine (STRATTERA) 10 MG capsule Take 10 mg by mouth daily. 12/21/18  Yes [provider]  calcium carbonate (TUMS EX) 750 MG chewable tablet Chew 2 tablets by mouth daily as needed for heartburn.   Yes [provider]  FLUoxetine (PROZAC) 20 MG capsule Take 20 mg by mouth daily.   Yes [provider]  gabapentin (NEURONTIN) 300 MG capsule Take 300 mg by mouth 3 (three) times daily.   Yes [provider]  Melatonin 5 MG CAPS Take 10 mg by mouth at bedtime.   Yes [provider]  naproxen sodium (ANAPROX) 550 MG tablet  Take 550 mg by mouth 2 (two) times daily.   Yes [provider]  Polyethyl Glycol-Propyl Glycol (SYSTANE) 0.4-0.3 % SOLN Place 1 drop into the left eye daily as needed (for dry eyes).   Yes [provider]     All other systems have been reviewed and were otherwise negative with the exception of those mentioned in the HPI and as above.  Physical Exam: There were no vitals filed for this visit.  There is no height or weight on file to calculate BMI.  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

## 2019-02-17 NOTE — Transfer of Care (Signed)
Immediate Anesthesia Transfer of Care Note  Patient: Trevor Clarke  Procedure(s) Performed: LEFT REVISION, LUMBAR FIVE - SACRUM ONE DECOMPRESSION AND TRANSFORAMINAL LUMBAR INTERBODY FUSION WITH INSTRUMENTATION AND ALLOGRAFT (Left Spine Lumbar)  Patient Location: PACU  Anesthesia Type:General  Level of Consciousness: drowsy and patient cooperative  Airway & Oxygen Therapy: Patient Spontanous Breathing and Patient connected to face mask oxygen  Post-op Assessment: Report given to RN and Post -op Vital signs reviewed and stable  Post vital signs: Reviewed and stable  Last Vitals:  Vitals Value Taken Time  BP    Temp    Pulse    Resp    SpO2      Last Pain:  Vitals:   02/17/19 0937  TempSrc:   PainSc: 4       Patients Stated Pain Goal: 3 (94/[id number])  Complications: No apparent anesthesia complications

## 2019-02-17 NOTE — Anesthesia Preprocedure Evaluation (Signed)
Anesthesia Evaluation  Patient identified by MRN, date of birth, ID band Patient awake    Reviewed: Allergy & Precautions, NPO status , Patient's Chart, lab work & pertinent test results  Airway Mallampati: I  TM Distance: >3 FB Neck ROM: Full    Dental   Pulmonary former smoker,    Pulmonary exam normal        Cardiovascular Normal cardiovascular exam     Neuro/Psych    GI/Hepatic   Endo/Other    Renal/GU      Musculoskeletal   Abdominal   Peds  Hematology   Anesthesia Other Findings   Reproductive/Obstetrics                             Anesthesia Physical Anesthesia Plan  ASA: II  Anesthesia Plan: General   Post-op Pain Management:    Induction: Intravenous  PONV Risk Score and Plan: 2 and Ondansetron, Treatment may vary due to age or medical condition and Midazolam  Airway Management Planned: Oral ETT  Additional Equipment:   Intra-op Plan:   Post-operative Plan: Extubation in OR  Informed Consent: I have reviewed the patients History and Physical, chart, labs and discussed the procedure including the risks, benefits and alternatives for the proposed anesthesia with the patient or authorized representative who has indicated his/her understanding and acceptance.       Plan Discussed with: CRNA and Surgeon  Anesthesia Plan Comments:         Anesthesia Quick Evaluation

## 2019-02-17 NOTE — Anesthesia Postprocedure Evaluation (Signed)
Anesthesia Post Note  Patient: JYLAN LOEZA  Procedure(s) Performed: LEFT REVISION, LUMBAR FIVE - SACRUM ONE DECOMPRESSION AND TRANSFORAMINAL LUMBAR INTERBODY FUSION WITH INSTRUMENTATION AND ALLOGRAFT (Left Spine Lumbar)     Patient location during evaluation: PACU Anesthesia Type: General Level of consciousness: awake and alert Pain management: pain level controlled Vital Signs Assessment: post-procedure vital signs reviewed and stable Respiratory status: spontaneous breathing, nonlabored ventilation, respiratory function stable and patient connected to nasal cannula oxygen Cardiovascular status: blood pressure returned to baseline and stable Postop Assessment: no apparent nausea or vomiting Anesthetic complications: no    Last Vitals:  Vitals:   02/17/19 0919  BP: 127/84  Pulse: 96  Resp: 20  Temp: 36.6 C  SpO2: 98%    Last Pain:  Vitals:   02/17/19 0937  TempSrc:   PainSc: 4                  Murriel Holwerda DAVID

## 2019-02-17 NOTE — Op Note (Signed)
PATIENT NAME: Trevor Clarke   MEDICAL RECORD NO.:   [id number]    PHYSICIAN:  Estill BambergMark Edwina Grossberg, MD      DATE OF BIRTH: 07/30/1985  ASSISTANT: Jason CoopKAYLA MCKENZIE, PA-C   DATE OF PROCEDURE: 02/17/2019                               OPERATIVE REPORT   PREOPERATIVE DIAGNOSES: 1. Left-sided lumbar radiculopathy 2. Severe L5-S1 degenerative disc disease 3. Status post previous L5-S1 microdiscectomy 4. Recurrent left L5/S1 disc herniation   POSTOPERATIVE DIAGNOSES: 1. Left-sided lumbar radiculopathy 2. Severe L5-S1 degenerative disc disease 3. Status post previous L5-S1 microdiscectomy 4. Recurrent left L5/S1 disc herniation   PROCEDURES: 1. Left-sided L5-S1 transforaminal lumbar interbody fusion. 2. Revision left L5/S1 decompression, including meticulous left S1 nerve dissection and discectomy 3. Right-sided L5-S1 posterolateral fusion. 4. Insertion of interbody device x1 (11 x 27 mm Concorde     intervertebral spacer). 5. Placement of posterior instrumentation at L5, S1 bilaterally. 6. Use of local autograft. 7. Use of morselized allograft - ViviGen. 8. Intraoperative use of fluoroscopy.   SURGEON:  Estill BambergMark Javiel Canepa, MD.   ASSISTANJason Coop:  Kayla McKenzie, PA-C.   ANESTHESIA:  General endotracheal anesthesia.   COMPLICATIONS:  None.   DISPOSITION:  Stable.   ESTIMATED BLOOD LOSS:   Minimal   INDICATIONS FOR SURGERY:  Briefly, Trevor Clarke is a pleasant 33 year old male who did present to me with severe and ongoing pain in the left leg s/p a work injury dated 04/07/2018.  Of note, he is status post a previous decompression at the L5-S1 level.  His MRI did reveal recurrent disc herniation and degenerative disc disease.  We did have an extensive discussion about surgery, and she did elect to proceed with the procedure noted above.      OPERATIVE DETAILS:  On 02/17/2019, the patient was brought to surgery and general endotracheal anesthesia was administered.  The patient was placed prone on a  well-padded flat Jackson bed with a spinal frame.  Antibiotics were given and a time-out procedure was performed. The back was prepped and draped in the usual fashion.  A midline incision was made overlying the L5-S1 intervertebral space.  The fascia was incised at the midline.  The paraspinal musculature was bluntly swept laterally.  Anatomic landmarks for the pedicles were exposed. Using fluoroscopy, I did cannulate the L5 and S1 pedicles bilaterally, using a medial to lateral cortical trajectory technique.  On the right side, the posterolateral gutter and right facet joint at L5/S1 was decorticated and a 6 x 35 mm screw was placed at L5, and a 7 x 40 mm screw was placed at S1.  A 40 mm rod was placed.  Distraction was then applied, and caps were provisionally tightened. On the left side, the cannulated pedicle holes were filled with bone wax.  I then proceeded with the decompressive aspect of the procedure.  On the left side, I did dissect through the area of scar in the region of the laminotomy defect at L5-S1.  The traversing left S1 nerve was noted to be very much adherent to the underlying disc herniation and to abundant scar noted about the epidural space.  I did meticulously dissect laterally to the left S1 nerve, meticulously using a nerve hook and micro-curettes.  I was able to develop a plane between the nerve and the surrounding tissue.  The left facet joint was resected.  Abundant disc material  was noted ventral to the nerve, and a thorough and adequate decompression was accomplished.  Of note, the decompression was very meticulous and time-consuming, taking approximately 60 minutes, given the extensive scar and adhesions noted circumferentially about the nerve.  At this point, with an assistant holding medial retraction of the traversing left S1 nerve, I did perform an annulotomy at the posterolateral aspect of the L5/S1 intervertebral space.  I then used a series of curettes and  pituitary rongeurs to perform a thorough and complete intervertebral diskectomy.  The intervertebral space was then liberally packed with autograft as well as allograft in the form of ViviGen, as was the appropriate-sized intervertebral spacer.  The spacer was then tamped into position in the usual fashion.  I was very pleased with the press-fit of the spacer.  I then placed a 6 x 30 mm screw on the left at L5 and a 7 x 40 mm screw at S1.  A 40-mm rod was then placed and caps were placed. The distraction was then released on the contralateral right side.  All 4 caps were then locked.  The wound was copiously irrigated with a total of approximately 3 L prior to placing the bone graft.  Additional autograft and allograft were then packed into the posterolateral gutter on the right side to help aid in the L5-S1 fusion.  The wound was explored for any undue bleeding and there was no substantial bleeding encountered.  Gel-Foam was placed over the laminectomy site.  The wound was then closed in layers using #1 Vicryl followed by 2-0 Vicryl, followed by 4-0 Monocryl.  Benzoin and Steri-Strips were applied followed by sterile dressing.  Of note, I did use triggered EMG to test the screws on the left, and there was no screw that tested below 20 mA.  There was no abnormal EMG activity noted throughout the entire surgery.   Of note, Pricilla Holm was my assistant throughout surgery, and did aid in retraction, suctioning, and closure.     Phylliss Bob, MD

## 2019-02-18 MED ORDER — TRAMADOL HCL 50 MG PO TABS: 50.0000 mg | ORAL_TABLET | Freq: Four times a day (QID) | ORAL | 0 refills | Status: AC | PRN

## 2019-02-18 MED ORDER — DIAZEPAM 5 MG PO TABS: 5.0000 mg | ORAL_TABLET | Freq: Four times a day (QID) | ORAL | 0 refills | Status: AC | PRN

## 2019-02-18 MED ORDER — OXYCODONE-ACETAMINOPHEN 5-325 MG PO TABS: 1.0000 | ORAL_TABLET | ORAL | 0 refills | Status: DC | PRN

## 2019-02-18 NOTE — Evaluation (Signed)
Physical Therapy Evaluation and Discharge Patient Details Name: Trevor Clarke MRN: [id number] DOB: [birth year] Today's Date: 02/18/2019   History of Present Illness  Pt is a 33 y/o male now s/p revision, decompression, and fusion, L5-S. PMHx includes previous cervical and spine surgery, hx of multiple ankle/LE surgeries   Clinical Impression  Patient evaluated by Physical Therapy with no further acute PT needs identified. All education has been completed and the patient has no further questions. At the time of PT eval pt was able to perform transfers and ambulation with gross modified independence and no AD. Pt's main goal is to be able to play with his 31 month old daughter. Pt was educated on car transfer, activity progression, and general safety with home mobility. See below for any follow-up Physical Therapy or equipment needs. PT is signing off. Thank you for this referral.     Follow Up Recommendations No PT follow up;Supervision - Intermittent    Equipment Recommendations  None recommended by PT    Recommendations for Other Services       Precautions / Restrictions Precautions Precautions: Back Precaution Booklet Issued: Yes (comment) Precaution Comments: issued and reviewed, pt able to recall 3/3 precautions end of session Required Braces or Orthoses: Spinal Brace Spinal Brace: Thoracolumbosacral orthotic Restrictions Weight Bearing Restrictions: No      Mobility  Bed Mobility Overal bed mobility: Modified Independent Bed Mobility: Rolling;Sit to Sidelying Rolling: Modified independent (Device/Increase time) Sidelying to sit: Supervision;HOB elevated     Sit to sidelying: Modified independent (Device/Increase time) General bed mobility comments: Pt was able to demonstrate proper log roll technique without cues.   Transfers Overall transfer level: Modified independent Equipment used: None Transfers: Sit to/from Stand Sit to Stand: Min guard;Min assist          General transfer comment: No unsteadiness or LOB noted.   Ambulation/Gait Ambulation/Gait assistance: Modified independent (Device/Increase time) Gait Distance (Feet): 400 Feet Assistive device: None Gait Pattern/deviations: Step-through pattern;Decreased stride length Gait velocity: Good Gait velocity interpretation: >2.62 ft/sec, indicative of community ambulatory General Gait Details: VC's for improved posture. Overall pt ambulating well without unsteadiness or LOB.   Stairs Stairs: Yes Stairs assistance: Modified independent (Device/Increase time) Stair Management: One rail Right;Step to pattern;Forwards Number of Stairs: 10 General stair comments: Favoring RLE because of ankle fusion in January. Good sequencing with no unsteadiness.   Wheelchair Mobility    Modified Rankin (Stroke Patients Only)       Balance Overall balance assessment: No apparent balance deficits (not formally assessed)                                           Pertinent Vitals/Pain Pain Assessment: 0-10 Pain Score: 8  Faces Pain Scale: Hurts little more Pain Location: back, incisional Pain Descriptors / Indicators: Guarding;Sore Pain Intervention(s): Monitored during session    Home Living Family/patient expects to be discharged to:: Private residence Living Arrangements: Spouse/significant other;Children Available Help at Discharge: Family;Available 24 hours/day Type of Home: House Home Access: Stairs to enter Entrance Stairs-Rails: Psychiatric nurse of Steps: 3 Home Layout: Two level;Able to live on main level with bedroom/bathroom   Additional Comments: grandparents live close by with handicap accessible bathroom including shower seat, grab bars, handheld shower head, and ramped entrance    Prior Function Level of Independence: Independent         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     Hand Dominance        Extremity/Trunk  Assessment   Upper Extremity Assessment Upper Extremity Assessment: Overall WFL for tasks assessed    Lower Extremity Assessment Lower Extremity Assessment: Overall WFL for tasks assessed    Cervical / Trunk Assessment Cervical / Trunk Assessment: Other exceptions Cervical / Trunk Exceptions: s/p spinal sx  Communication   Communication: No difficulties  Cognition Arousal/Alertness: Awake/alert Behavior During Therapy: WFL for tasks assessed/performed Overall Cognitive Status: Within Functional Limits for tasks assessed                                        General Comments      Exercises     Assessment/Plan    PT Assessment Patent does not need any further PT services  PT Problem List         PT Treatment Interventions      PT Goals (Current goals can be found in the Care Plan section)  Acute Rehab PT Goals Patient Stated Goal: home, regain independence to get back to work and play with his daughter PT Goal Formulation: All assessment and education complete, DC therapy    Frequency     Barriers to discharge        Co-evaluation               AM-PAC PT "6 Clicks" Mobility  Outcome Measure Help needed turning from your back to your side while in a flat bed without using bedrails?: None Help needed moving from lying on your back to sitting on the side of a flat bed without using bedrails?: None Help needed moving to and from a bed to a chair (including a wheelchair)?: None Help needed standing up from a chair using your arms (e.g., wheelchair or bedside chair)?: None Help needed to walk in hospital room?: None Help needed climbing 3-5 steps with a railing? : None 6 Click Score: 24    End of Session Equipment Utilized During Treatment: Gait belt Activity Tolerance: Patient tolerated treatment well Patient left: in bed;with call bell/phone within reach Nurse Communication: Mobility status PT Visit Diagnosis: Pain Pain - part of body:  (back)    Time: [id number] PT Time Calculation (min) (ACUTE ONLY): 17 min   Charges:   PT Evaluation $PT Eval Moderate Complexity: 1 Mod          Trevor Clarke, PT, DPT Acute Rehabilitation Services Pager: [phone] Office: [phone]   Trevor Clarke 02/18/2019, 11:48 AM

## 2019-02-18 NOTE — Progress Notes (Signed)
    Patient doing well  Patient denies leg leg pain Has been ambulating + expected LBP  Physical Exam: Vitals:   02/17/19 2327 02/18/19 0424  BP: (!) 152/83 130/74  Pulse: 90 80  Resp: 20 20  Temp: 98.1 F (36.7 C) 97.8 F (36.6 C)  SpO2: 98% 99%    Dressing in place NVI  POD #1 s/p revision decompression and fusion, L5/S1  - up with PT/OT, encourage ambulation - Tramadol for pain, Valium for muscle spasms - likely d/c home today with f/u in 2 weeks

## 2019-02-18 NOTE — Evaluation (Signed)
Occupational Therapy Evaluation Patient Details Name: Trevor Clarke MRN: [id number] DOB: [birth year] Today's Date: 02/18/2019    History of Present Illness Pt is a 33 y/o male now s/p revision, decompression, and fusion, L5-S. PMHx includes previous cervical and spine surgery, hx of multiple ankle/LE surgeries    Clinical Impression   This 33 y/o male presents with the above. PTA pt reports independence with ADL, iADL and functional mobility. Pt very pleasant and willing to participate in therapy session. Pt performing functional mobility without AD at overall supervision level. He currently requires setup assist for seated UB ADL, minA for LB ADL. Pt reports plans to return home with spouse assist as well as plans to utilize his grandparents home for daily ADL tasks as their home is handicap accessible. Reviewed back precautions, brace management, safety and compensatory techniques for completing ADL and functional transfers with pt verbalizing and/or return demonstrating understanding. Questions answered throughout with no further acute OT needs identified at this time. Feel pt is safe to return home once medically ready given available family support. Acute OT to sign off, thank you for this referral.     Follow Up Recommendations  No OT follow up;Supervision/Assistance - 24 hour(24hr initially)    Equipment Recommendations  None recommended by OT(pt's DME needs are met)           Precautions / Restrictions Precautions Precautions: Back Precaution Booklet Issued: Yes (comment) Precaution Comments: issued and reviewed, pt able to recall 3/3 precautions end of session Required Braces or Orthoses: Spinal Brace Spinal Brace: Thoracolumbosacral orthotic Restrictions Weight Bearing Restrictions: No      Mobility Bed Mobility Overal bed mobility: Needs Assistance Bed Mobility: Rolling;Sidelying to Sit Rolling: Supervision Sidelying to sit: Supervision;HOB elevated       General  bed mobility comments: verbally reviewed log roll technique, pt demonstrating with supervision for safety  Transfers Overall transfer level: Needs assistance Equipment used: None Transfers: Sit to/from Stand Sit to Stand: Min guard;Min assist         General transfer comment: overall minguard assist for balance and safety;  minA to rise from lower surface of recliner chair    Balance Overall balance assessment: No apparent balance deficits (not formally assessed)                                         ADL either performed or assessed with clinical judgement   ADL Overall ADL's : Needs assistance/impaired Eating/Feeding: Independent;Sitting   Grooming: Supervision/safety;Standing   Upper Body Bathing: Supervision/ safety;Sitting   Lower Body Bathing: Min guard;Minimal assistance;Sit to/from stand   Upper Body Dressing : Set up;Sitting Upper Body Dressing Details (indicate cue type and reason): pt donning/managing TLSO brace seated EOB without difficulty Lower Body Dressing: Minimal assistance;Sit to/from stand Lower Body Dressing Details (indicate cue type and reason): pt required assist to adjust socks seated EOB, reports was able to don shorts earlier this AM with increased time/effort; educated in use of reacher to assist with LB ADL task  Toilet Transfer: Supervision/safety;Min guard;Ambulation Toilet Transfer Details (indicate cue type and reason): simulated via transfer to/from EOB and recliner, room level mobility Toileting- Clothing Manipulation and Hygiene: Minimal assistance;Sit to/from stand Toileting - Clothing Manipulation Details (indicate cue type and reason): pt reports difficulty with peri-care. educated in use of toilet aide as method for completing and where to obtain AE, pt verbalizing understanding  Functional mobility during ADLs: Min guard;Supervision/safety General ADL Comments: educated pt in brace management, back precautions,  safety and compensatory techniques for completing ADL and functional transfers      Vision         Perception     Praxis      Pertinent Vitals/Pain Pain Assessment: Faces Faces Pain Scale: Hurts even more Pain Location: back, incisional Pain Descriptors / Indicators: Guarding;Sore Pain Intervention(s): Monitored during session;Repositioned;Limited activity within patient's tolerance     Hand Dominance     Extremity/Trunk Assessment Upper Extremity Assessment Upper Extremity Assessment: Overall WFL for tasks assessed   Lower Extremity Assessment Lower Extremity Assessment: Defer to PT evaluation   Cervical / Trunk Assessment Cervical / Trunk Assessment: Other exceptions Cervical / Trunk Exceptions: s/p spinal sx   Communication Communication Communication: No difficulties   Cognition Arousal/Alertness: Awake/alert Behavior During Therapy: WFL for tasks assessed/performed Overall Cognitive Status: Within Functional Limits for tasks assessed                                     General Comments       Exercises     Shoulder Instructions      Home Living Family/patient expects to be discharged to:: Private residence Living Arrangements: Spouse/significant other;Children Available Help at Discharge: Family;Available 24 hours/day Type of Home: House Home Access: Stairs to enter Entrance Stairs-Number of Steps: 3 Entrance Stairs-Rails: Right;Left Home Layout: Two level;Able to live on main level with bedroom/bathroom     Bathroom Shower/Tub: Walk-in shower   Bathroom Toilet: Standard         Additional Comments: grandparents live close by with handicap accessible bathroom including shower seat, grab bars, handheld shower head, and ramped entrance      Prior Functioning/Environment Level of Independence: Independent        Comments: previously working as an electrician        OT Problem List: Decreased strength;Decreased activity  tolerance;Decreased knowledge of use of DME or AE;Decreased knowledge of precautions;Obesity;Pain      OT Treatment/Interventions:      OT Goals(Current goals can be found in the care plan section) Acute Rehab OT Goals Patient Stated Goal: home, regain independence to get back to work and play with his daughter OT Goal Formulation: All assessment and education complete, DC therapy  OT Frequency:     Barriers to D/C:            Co-evaluation              AM-PAC OT "6 Clicks" Daily Activity     Outcome Measure Help from another person eating meals?: None Help from another person taking care of personal grooming?: None Help from another person toileting, which includes using toliet, bedpan, or urinal?: A Little Help from another person bathing (including washing, rinsing, drying)?: A Little Help from another person to put on and taking off regular upper body clothing?: None Help from another person to put on and taking off regular lower body clothing?: A Little 6 Click Score: 21   End of Session Equipment Utilized During Treatment: Back brace Nurse Communication: Mobility status  Activity Tolerance: Patient tolerated treatment well Patient left: with call bell/phone within reach;Other (comment)(seated EOB)  OT Visit Diagnosis: Other abnormalities of gait and mobility (R26.89);Pain Pain - part of body: (back)                Time:   0833-0859 OT Time Calculation (min): 26 min Charges:  OT General Charges $OT Visit: 1 Visit OT Evaluation $OT Eval Low Complexity: 1 Low OT Treatments $Self Care/Home Management : 8-22 mins  Lou Cal, OT Supplemental Rehabilitation Services Pager [phone] Office [phone]   Raymondo Band 02/18/2019, 10:02 AM

## 2019-02-18 NOTE — Progress Notes (Signed)
Pt doing well. Pt and wife given D/C instructions with verbal understanding. Rx's were sent to pharmacy by MD. Pt's incision is clean and dry with no sign of infection. Pt's IV was removed prior to D/C. Pt D/C'd home via wheelchair @ 1050 per MD order. Pt is stable @ D/C and has no other needs at this time. Holli Humbles, RN

## 2019-02-18 NOTE — Progress Notes (Signed)
Orthopedic Tech Progress Note Patient Details:  Trevor Clarke 1985-12-10 [id number] RN said patient has brace Patient ID: [name], male   DOB: 06/25/1986, 33 y.o.   MRN: [id number]   Janit Pagan 02/18/2019, 8:34 AM

## 2019-02-22 MED FILL — Sodium Chloride IV Soln 0.9%: INTRAVENOUS | Qty: 1000 | Status: AC

## 2019-02-22 MED FILL — Heparin Sodium (Porcine) Inj 1000 Unit/ML: INTRAMUSCULAR | Qty: 30 | Status: AC

## 2019-02-24 NOTE — Discharge Summary (Signed)
Patient ID: Trevor CoteMatthew T Clarke MRN: [id number] DOB/AGE: [birth year] [age] y.o.  Admit date: 02/17/2019 Discharge date: 02/18/2019  Admission Diagnoses:  Active Problems:   Radiculopathy   Discharge Diagnoses:  Same  Past Medical History:  Diagnosis Date  . Chronic back pain   . Medical history non-contributory     Surgeries: Procedure(s): LEFT REVISION, LUMBAR FIVE - SACRUM ONE DECOMPRESSION AND TRANSFORAMINAL LUMBAR INTERBODY FUSION WITH INSTRUMENTATION AND ALLOGRAFT on 02/17/2019   Consultants: None  Discharged Condition: Improved  Hospital Course: Trevor CoteMatthew T Trevor Clarke is an 33 y.o. male who was admitted 02/17/2019 for operative treatment of radiculopathy. Patient has severe unremitting pain that affects sleep, daily activities, and work/hobbies. After pre-op clearance the patient was taken to the operating room on 02/17/2019 and underwent  Procedure(s): LEFT REVISION, LUMBAR FIVE - SACRUM ONE DECOMPRESSION AND TRANSFORAMINAL LUMBAR INTERBODY FUSION WITH INSTRUMENTATION AND ALLOGRAFT.    Patient was given perioperative antibiotics:  Anti-infectives (From admission, onward)   Start     Dose/Rate Route Frequency Ordered Stop   02/17/19 2200  vancomycin (VANCOCIN) 1,500 mg in sodium chloride 0.9 % 500 mL IVPB     1,500 mg 250 mL/hr over 120 Minutes Intravenous  Once 02/17/19 1834 02/17/19 2314   02/17/19 0600  vancomycin (VANCOCIN) 1,500 mg in sodium chloride 0.9 % 500 mL IVPB     1,500 mg 250 mL/hr over 120 Minutes Intravenous On call to O.R. 02/16/19 0753 02/17/19 1140       Patient was given sequential compression devices, early ambulation to prevent DVT.  Patient benefited maximally from hospital stay and there were no complications.    Recent vital signs: BP 124/81 (BP Location: Right Arm)   Pulse 88   Temp 97.7 F (36.5 C) (Oral)   Resp 18   Ht 6' (1.829 m)   Wt 126.1 kg   SpO2 98%   BMI 37.70 kg/m     Discharge Medications:   Allergies as of 02/18/2019    Reactions   Penicillins Other (See Comments)   Childhood allergy, found out allergy when IV was taken out at hospital Did it involve swelling of the face/tongue/throat, SOB, or low BP? Unknown Did it involve sudden or severe rash/hives, skin peeling, or any reaction on the inside of your mouth or nose? Unknown Did you need to seek medical attention at a hospital or doctor's office? Unknown When did it last happen?Childhood allergy If all above answers are "NO", may proceed with cephalosporin use.      Medication List    TAKE these medications   atomoxetine 10 MG capsule Commonly known as: STRATTERA Take 10 mg by mouth daily.   calcium carbonate 750 MG chewable tablet Commonly known as: TUMS EX Chew 2 tablets by mouth daily as needed for heartburn.   diazepam 5 MG tablet Commonly known as: VALIUM Take 1 tablet (5 mg total) by mouth every 6 (six) hours as needed for muscle spasms.   FLUoxetine 20 MG capsule Commonly known as: PROZAC Take 20 mg by mouth daily.   Melatonin 5 MG Caps Take 10 mg by mouth at bedtime.   Systane 0.4-0.3 % Soln Generic drug: Polyethyl Glycol-Propyl Glycol Place 1 drop into the left eye daily as needed (for dry eyes).   traMADol 50 MG tablet Commonly known as: ULTRAM Take 1-2 tablets (50-100 mg total) by mouth every 6 (six) hours as needed for moderate pain.   VITAMIN C PO Take 240 mg by mouth daily.  Diagnostic Studies: Dg Lumbar Spine 2-3 Views  Result Date: 02/17/2019 CLINICAL DATA:  L5-S1 surgical fusion. EXAM: DG C-ARM 61-120 MIN; LUMBAR SPINE - 2-3 VIEW FLUOROSCOPY TIME:  1 minutes 22 seconds. COMPARISON:  Radiograph of same day. FINDINGS: Three intraoperative fluoroscopic images were obtained of the lower lumbar spine. These images demonstrate the patient be status post surgical posterior fusion of L5-S1. IMPRESSION: Fluoroscopic guidance provided during surgical posterior fusion of L5-S1. Electronically Signed   By: Marijo Conception M.D.   On: 02/17/2019 16:58   Dg Lumbar Spine 1 View  Result Date: 02/17/2019 CLINICAL DATA:  Intraoperative localization EXAM: LUMBAR SPINE - 1 VIEW COMPARISON:  None. FINDINGS: Needles are noted in the posterior soft tissues at the L4 and S1 level. Prior pelvic fixation plate is noted. IMPRESSION: Intraoperative localization as described. Electronically Signed   By: Inez Catalina M.D.   On: 02/17/2019 12:02   Dg C-arm 1-60 Min  Result Date: 02/17/2019 CLINICAL DATA:  L5-S1 surgical fusion. EXAM: DG C-ARM 61-120 MIN; LUMBAR SPINE - 2-3 VIEW FLUOROSCOPY TIME:  1 minutes 22 seconds. COMPARISON:  Radiograph of same day. FINDINGS: Three intraoperative fluoroscopic images were obtained of the lower lumbar spine. These images demonstrate the patient be status post surgical posterior fusion of L5-S1. IMPRESSION: Fluoroscopic guidance provided during surgical posterior fusion of L5-S1. Electronically Signed   By: Marijo Conception M.D.   On: 02/17/2019 16:58    Disposition: Discharge disposition: 01-Home or Self Care       Discharge Instructions    Discharge patient   Complete by: As directed    Discharge disposition: 01-Home or Self Care   Discharge patient date: 02/18/2019     POD #1 s/p revision decompression and fusion, L5/S1  - up with PT/OT, encourage ambulation - Tramadol for pain, Valium for muscle spasms - likely d/c home today with f/u in 2 weeks Signed: Justice Britain 02/24/2019, 9:13 AM

## 2020-12-24 IMAGING — RF DG C-ARM 61-120 MIN
1 series · 3 of 3 positions shown · non-contrast
Comparison: Radiograph of same day.

CLINICAL DATA: L5-S1 surgical fusion.

EXAM:
DG C-ARM 61-120 MIN; LUMBAR SPINE - 2-3 VIEW
FLUOROSCOPY TIME:  1 minutes 22 seconds.

[Series 1: run · 3 of 3 slices shown]
[im 1/3]
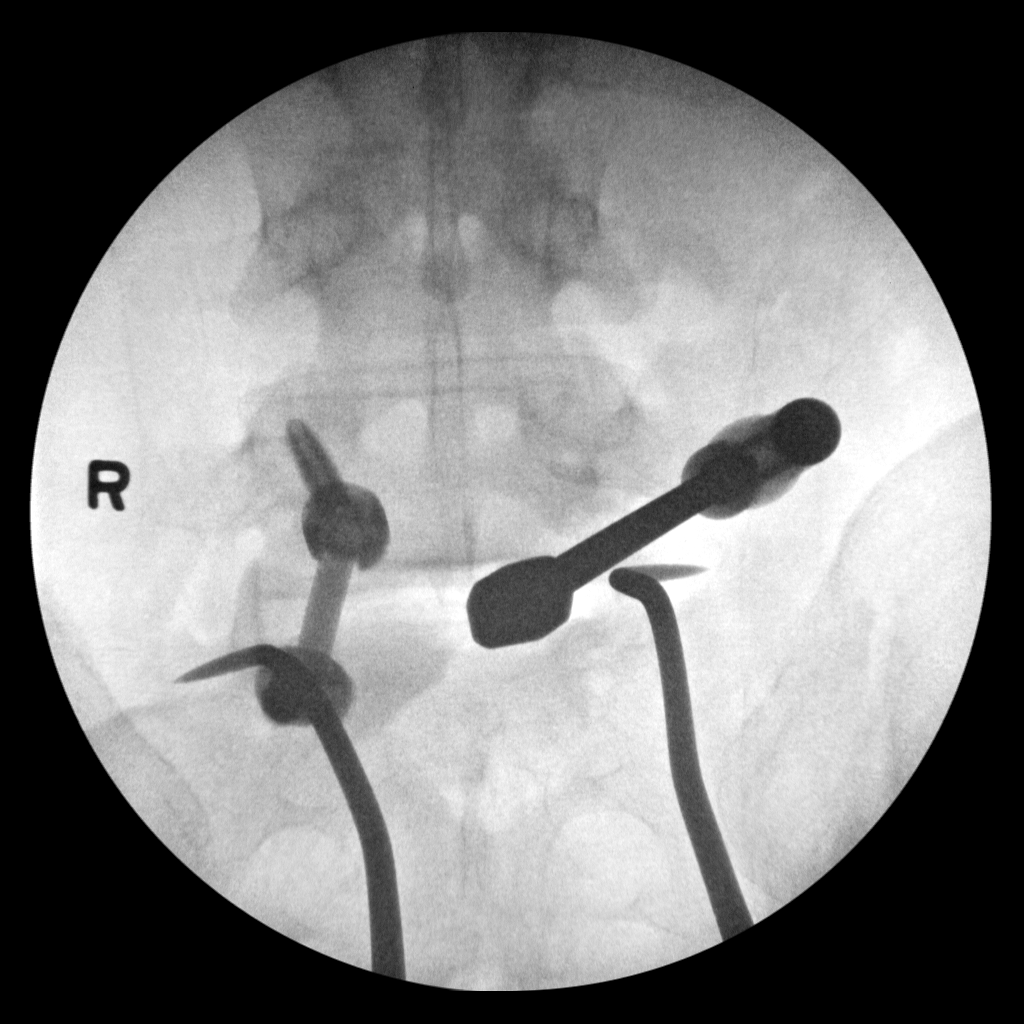
[im 2/3]
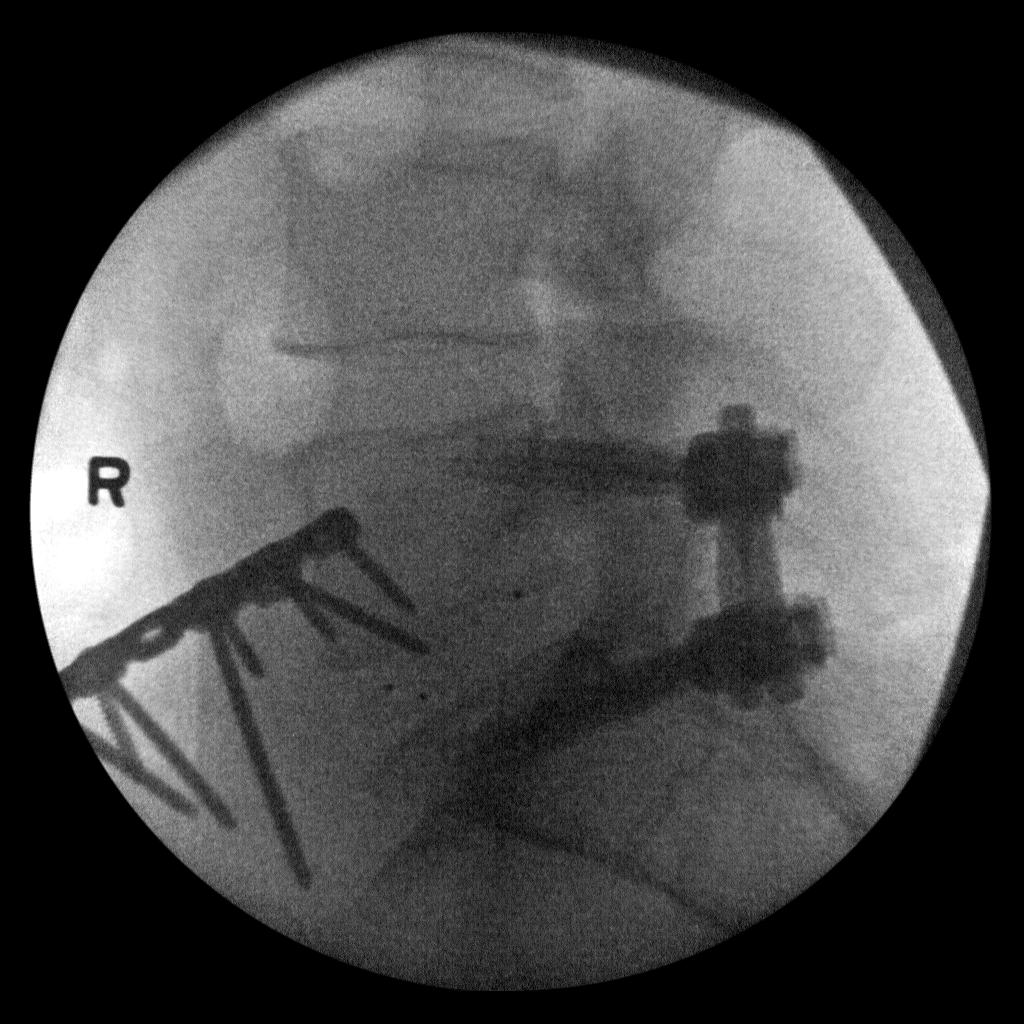
[im 3/3]
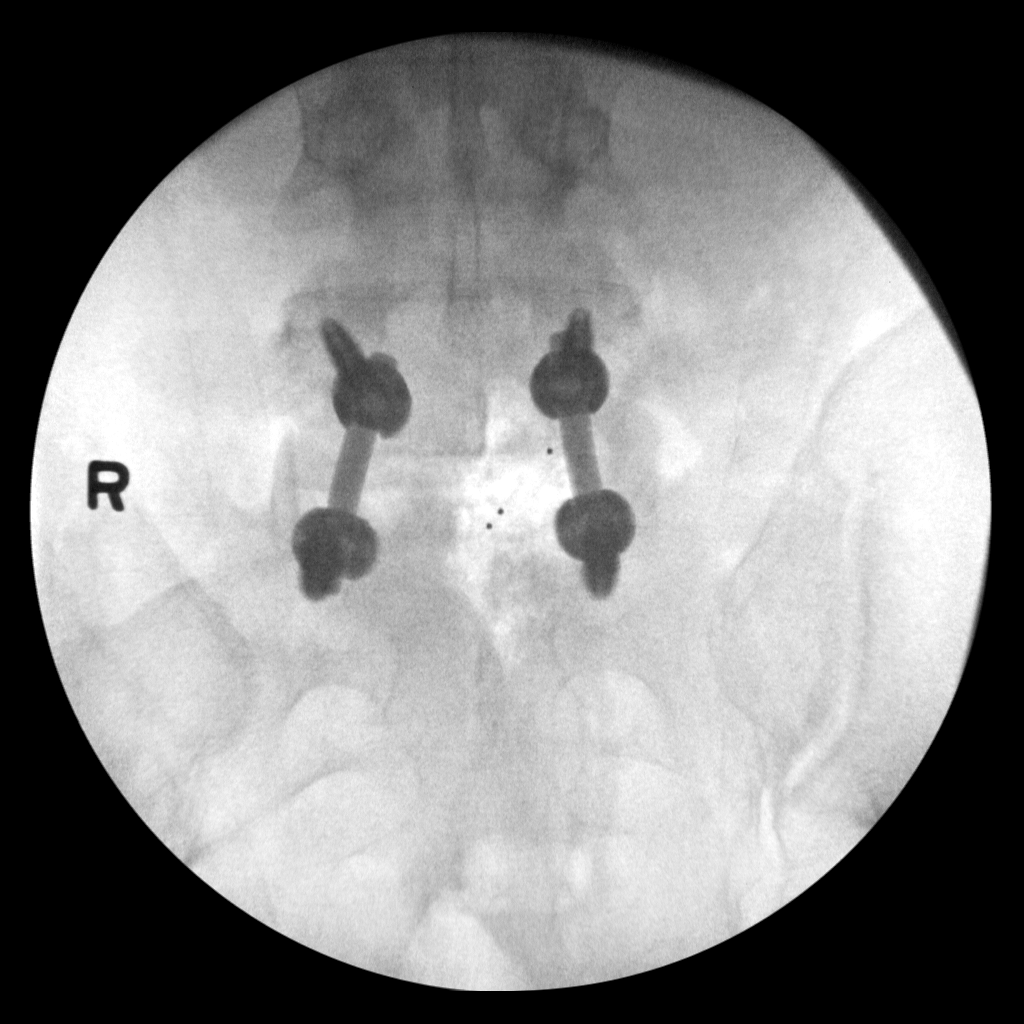

[3 of 3 positions shown; findings below may reference images not displayed]

FINDINGS: Three intraoperative fluoroscopic images were obtained of the lower
lumbar spine. These images demonstrate the patient be status post
surgical posterior fusion of L5-S1.
IMPRESSION: Fluoroscopic guidance provided during surgical posterior fusion of
L5-S1.

## 2020-12-24 IMAGING — RF LUMBAR SPINE - 2-3 VIEW
1 series · 3 of 3 positions shown · non-contrast
Comparison: Radiograph of same day.

CLINICAL DATA: L5-S1 surgical fusion.

EXAM:
DG C-ARM 61-120 MIN; LUMBAR SPINE - 2-3 VIEW
FLUOROSCOPY TIME:  1 minutes 22 seconds.

[Series 1: run · 3 of 3 slices shown]
[im 1/3]
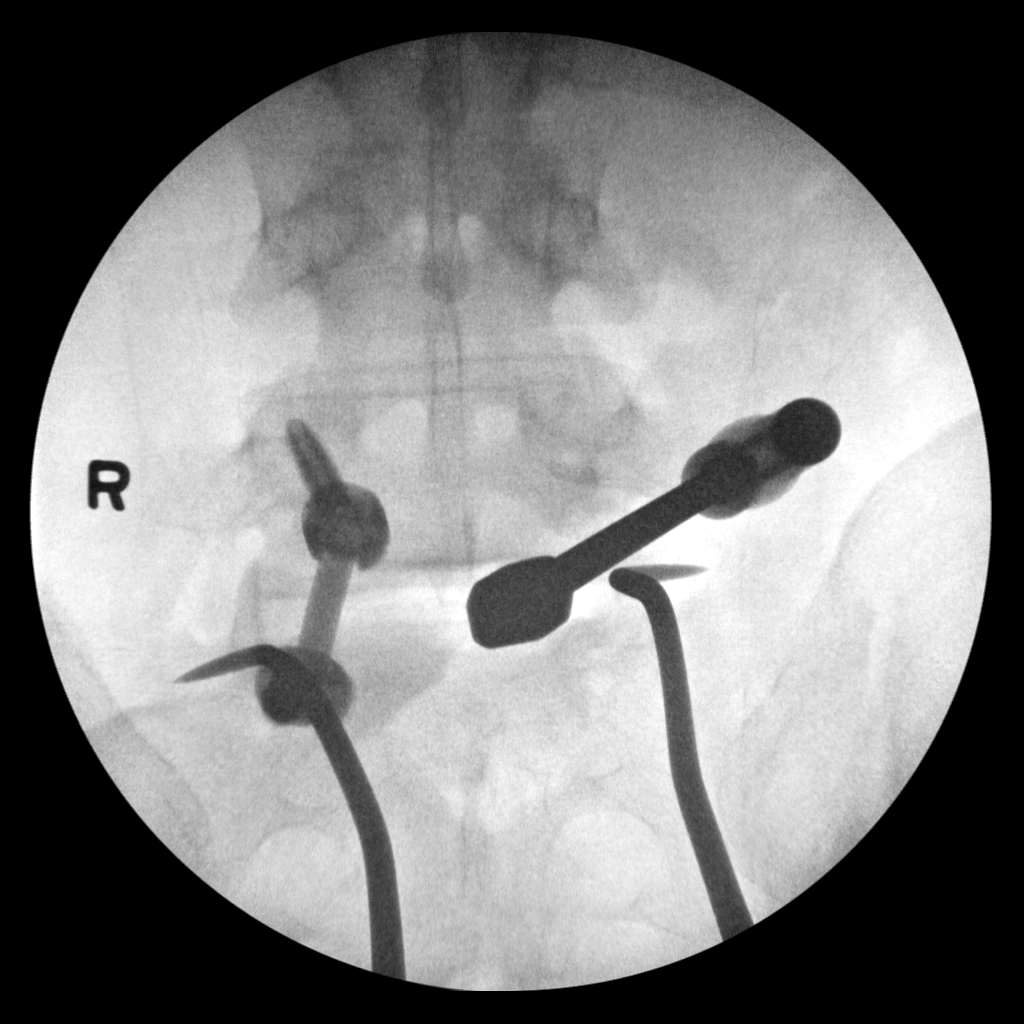
[im 2/3]
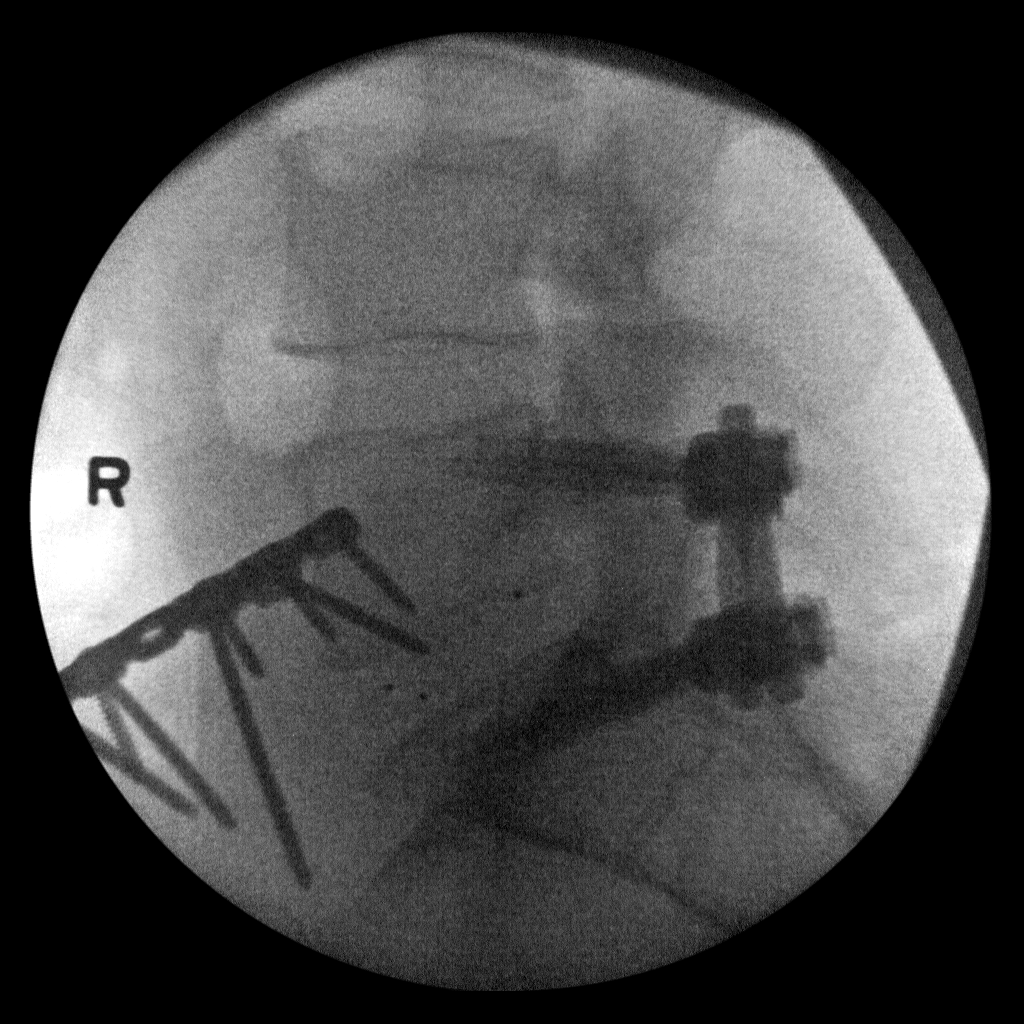
[im 3/3]
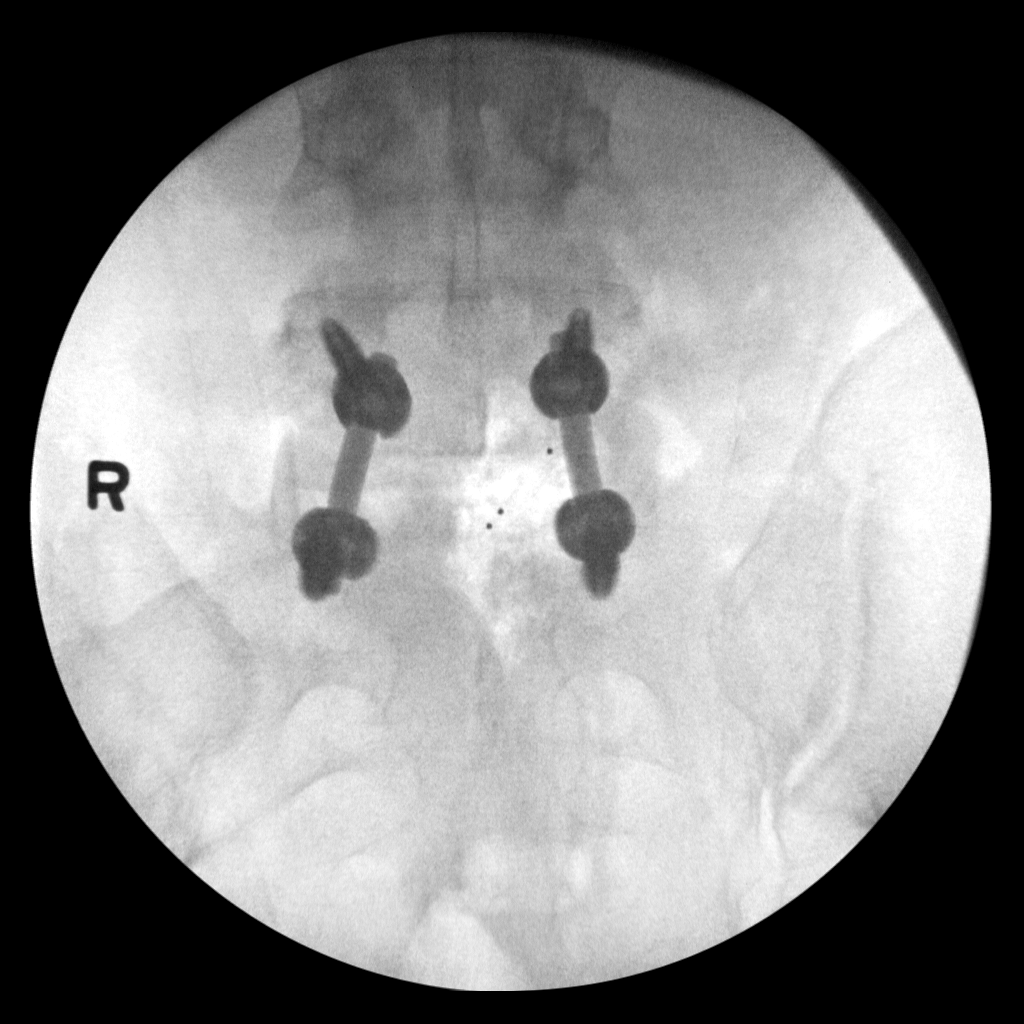

[3 of 3 positions shown; findings below may reference images not displayed]

FINDINGS: Three intraoperative fluoroscopic images were obtained of the lower
lumbar spine. These images demonstrate the patient be status post
surgical posterior fusion of L5-S1.
IMPRESSION: Fluoroscopic guidance provided during surgical posterior fusion of
L5-S1.

## 2023-08-25 ENCOUNTER — Emergency Department (HOSPITAL_BASED_OUTPATIENT_CLINIC_OR_DEPARTMENT_OTHER): Payer: BC Managed Care – PPO

## 2023-08-25 ENCOUNTER — Other Ambulatory Visit: Payer: Self-pay

## 2023-08-25 ENCOUNTER — Emergency Department (HOSPITAL_BASED_OUTPATIENT_CLINIC_OR_DEPARTMENT_OTHER)
Admission: EM | Admit: 2023-08-25 | Discharge: 2023-08-25 | Disposition: A | Discharge status: Other Acute Inpatient Hospital | Payer: BC Managed Care – PPO | Attending: Emergency Medicine | Admitting: Emergency Medicine

## 2023-08-25 ENCOUNTER — Encounter (HOSPITAL_BASED_OUTPATIENT_CLINIC_OR_DEPARTMENT_OTHER): Payer: Self-pay | Admitting: Emergency Medicine

## 2023-08-25 DIAGNOSIS — K3589 Other acute appendicitis without perforation or gangrene: Secondary | ICD-10-CM | POA: Diagnosis not present

## 2023-08-25 DIAGNOSIS — R1031 Right lower quadrant pain: Secondary | ICD-10-CM | POA: Diagnosis present

## 2023-08-25 LAB — COMPREHENSIVE METABOLIC PANEL
ALT: 36 U/L (ref 0–44)
AST: 28 U/L (ref 15–41)
Albumin: 4.4 g/dL (ref 3.5–5.0)
Alkaline Phosphatase: 103 U/L (ref 38–126)
Anion gap: 9 (ref 5–15)
BUN: 13 mg/dL (ref 6–20)
CO2: 23 mmol/L (ref 22–32)
Calcium: 9.4 mg/dL (ref 8.9–10.3)
Chloride: 103 mmol/L (ref 98–111)
Creatinine, Ser: 0.56 mg/dL — ABNORMAL LOW (ref 0.61–1.24)
GFR, Estimated: >60 mL/min (ref >60)
Glucose, Bld: 77 mg/dL (ref 70–99)
Potassium: 4.2 mmol/L (ref 3.5–5.1)
Sodium: 135 mmol/L (ref 135–145)
Total Bilirubin: 0.7 mg/dL (ref 0.0–1.2)
Total Protein: 8 g/dL (ref 6.5–8.1)

## 2023-08-25 LAB — URINALYSIS, ROUTINE W REFLEX MICROSCOPIC
Bilirubin Urine: NEGATIVE
Glucose, UA: NEGATIVE mg/dL
Hgb urine dipstick: NEGATIVE
Ketones, ur: NEGATIVE mg/dL
Leukocytes,Ua: NEGATIVE
Nitrite: NEGATIVE
Protein, ur: NEGATIVE mg/dL
Specific Gravity, Urine: 1.02 (ref 1.005–1.030)
pH: 7.5 (ref 5.0–8.0)

## 2023-08-25 LAB — CBC
HCT: 47.7 % (ref 39.0–52.0)
Hemoglobin: 15.8 g/dL (ref 13.0–17.0)
MCH: 28.3 pg (ref 26.0–34.0)
MCHC: 33.1 g/dL (ref 30.0–36.0)
MCV: 85.3 fL (ref 80.0–100.0)
Platelets: 280 10*3/uL (ref 150–400)
RBC: 5.59 MIL/uL (ref 4.22–5.81)
RDW: 13 % (ref 11.5–15.5)
WBC: 8.5 10*3/uL (ref 4.0–10.5)
nRBC: 0 % (ref 0.0–0.2)

## 2023-08-25 LAB — LIPASE, BLOOD: Lipase: 30 U/L (ref 11–51)

## 2023-08-25 MED ORDER — SODIUM CHLORIDE 0.9 % IV SOLN
2.0000 g | Freq: Once | INTRAVENOUS | Status: AC
  Administered 2023-08-25: 2 g via INTRAVENOUS
  Filled 2023-08-25: qty 20

## 2023-08-25 MED ORDER — IOHEXOL 300 MG/ML  SOLN
125.0000 mL | Freq: Once | INTRAMUSCULAR | Status: AC | PRN
  Administered 2023-08-25: 125 mL via INTRAVENOUS

## 2023-08-25 NOTE — ED Notes (Signed)
 [facility] called for report. Transferred to RN

## 2023-08-25 NOTE — ED Provider Notes (Signed)
 [facility] Provider Note   CSN: [id number] Arrival date & time: 08/25/23  1408     History {Add pertinent medical, surgical, social history, OB history to HPI:1} Chief Complaint  Patient presents with   Abdominal Pain    KALAN YELEY is a 38 y.o. male.   Abdominal Pain   38 year old male presents emergency department complaints of right lower abdominal pain.  Symptoms began Saturday morning when he woke up.  Denies radiation of pain.  States the pain has been constant since onset.  Denies any fever, chills, nausea, vomiting, urinary symptoms, change in bowel habits.  Patient saw his primary care today who sent to the emergency department for appendicitis rule out.  Denies any history of intra-abdominal surgeries.  Past medical history significant for anemia, depression, chronic pain  Home Medications Prior to Admission medications   Medication Sig Start Date End Date Taking? Authorizing Provider  Ascorbic Acid (VITAMIN C PO) Take 240 mg by mouth daily.    [provider]  atomoxetine (STRATTERA) 10 MG capsule Take 10 mg by mouth daily. 12/21/18   [provider]  calcium carbonate (TUMS EX) 750 MG chewable tablet Chew 2 tablets by mouth daily as needed for heartburn.    [provider]  diazepam (VALIUM) 5 MG tablet Take 1 tablet (5 mg total) by mouth every 6 (six) hours as needed for muscle spasms. 02/18/19   McKenzie, Eilene Ghazi, PA-C  FLUoxetine (PROZAC) 20 MG capsule Take 20 mg by mouth daily.    [provider]  Melatonin 5 MG CAPS Take 10 mg by mouth at bedtime.    [provider]  Polyethyl Glycol-Propyl Glycol (SYSTANE) 0.4-0.3 % SOLN Place 1 drop into the left eye daily as needed (for dry eyes).    [provider]  traMADol (ULTRAM) 50 MG tablet Take 1-2 tablets (50-100 mg total) by mouth every 6 (six) hours as needed for moderate pain. 02/18/19   McKenzie, Eilene Ghazi, PA-C       Allergies    Penicillins    Review of Systems   Review of Systems  Gastrointestinal:  Positive for abdominal pain.  All other systems reviewed and are negative.   Physical Exam Updated Vital Signs BP 126/83 (BP Location: Left Arm)   Pulse (!) 104   Temp 98.2 F (36.8 C)   Resp 18   Ht 6' (1.829 m)   Wt 127 kg   SpO2 99%   BMI 37.97 kg/m  Physical Exam Vitals and nursing note reviewed.  Constitutional:      General: He is not in acute distress.    Appearance: He is well-developed.  HENT:     Head: Normocephalic and atraumatic.  Eyes:     Conjunctiva/sclera: Conjunctivae normal.  Cardiovascular:     Rate and Rhythm: Normal rate and regular rhythm.     Heart sounds: No murmur heard. Pulmonary:     Effort: Pulmonary effort is normal. No respiratory distress.     Breath sounds: Normal breath sounds.  Abdominal:     Palpations: Abdomen is soft.     Tenderness: There is abdominal tenderness in the right lower quadrant. There is no right CVA tenderness, left CVA tenderness or guarding. Positive signs include Rovsing's sign.  Musculoskeletal:        General: No swelling.     Cervical back: Neck supple.  Skin:    General: Skin is warm and dry.  Capillary Refill: Capillary refill takes less than 2 seconds.  Neurological:     Mental Status: He is alert.  Psychiatric:        Mood and Affect: Mood normal.     ED Results / Procedures / Treatments   Labs (all labs ordered are listed, but only abnormal results are displayed) Labs Reviewed  COMPREHENSIVE METABOLIC PANEL - Abnormal; Notable for the following components:      Result Value   Creatinine, Ser 0.56 (*)    All other components within normal limits  URINALYSIS, ROUTINE W REFLEX MICROSCOPIC - Abnormal; Notable for the following components:   APPearance CLOUDY (*)    All other components within normal limits  LIPASE, BLOOD  CBC    EKG None  Radiology No results found.  Procedures Procedures   {Document cardiac monitor, telemetry assessment procedure when appropriate:1}  Medications Ordered in ED Medications  iohexol (OMNIPAQUE) 300 MG/ML solution 125 mL (125 mLs Intravenous Contrast Given 08/25/23 1517)    ED Course/ Medical Decision Making/ A&P   {   Click here for ABCD2, HEART and other calculatorsREFRESH Note before signing :1}                              Medical Decision Making Amount and/or Complexity of Data Reviewed Labs: ordered. Radiology: ordered.  Risk Prescription drug management. Decision regarding hospitalization.   This patient presents to the ED for concern of abdominal pain, this involves an extensive number of treatment options, and is a complaint that carries with it a high risk of complications and morbidity.  The differential diagnosis includes appendicitis, SBO/LBO, volvulus, diverticulitis, cystitis, pyelonephritis, nephrolithiasis, other   Co morbidities that complicate the patient evaluation  See HPI   Additional history obtained:  Additional history obtained from EMR External records from outside source obtained and reviewed including hospital records   Lab Tests:  I Ordered, and personally interpreted labs.  The pertinent results include: No leukocytosis.  No evidence of anemia.  Platelets within range.  No Electra abnormalities.  No transaminitis.  No renal dysfunction.  Lipase within normal limits.  UA without abnormality.   Imaging Studies ordered:  I ordered imaging studies including CT abdomen pelvis I independently visualized and interpreted imaging which showed tip appendicitis.  No pneumoperitoneum.  I agree with the radiologist interpretation  Cardiac Monitoring: / EKG:  The patient was maintained on a cardiac monitor.  I personally viewed and interpreted the cardiac monitored which showed an underlying rhythm of: Sinus rhythm   Consultations Obtained:  N/a   Problem List / ED Course / Critical interventions /  Medication management  Appendicitis Reevaluation of the patient showed that the patient stayed the same I have reviewed the patients home medicines and have made adjustments as needed   Social Determinants of Health:  Former cigarette use.  Denies illicit drug use.   Test / Admission - Considered:  Appendicitis Vitals signs within normal range and stable throughout visit. Laboratory/imaging studies significant for: See above *** Worrisome signs and symptoms were discussed with the patient, and the patient acknowledged understanding to return to the ED if noticed. Patient was stable upon discharge.    {Document critical care time when appropriate:1} {Document review of labs and clinical decision tools ie heart score, Chads2Vasc2 etc:1}  {Document your independent review of radiology images, and any outside records:1} {Document your discussion with family members, caretakers, and with consultants:1} {Document social determinants of  health affecting pt's care:1} {Document your decision making why or why not admission, treatments were needed:1} Final Clinical Impression(s) / ED Diagnoses Final diagnoses:  None    Rx / DC Orders ED Discharge Orders     None

## 2023-08-25 NOTE — ED Triage Notes (Signed)
 Rt lower abd pain since sat , no fever no vomiting  hurts to take a deep breathe and walk
